# Patient Record
Sex: Male | Born: 1935 | Race: White | Hispanic: No | Marital: Married | Smoking: Former smoker
Health system: Southern US, Community
[De-identification: ages and names within clinical notes are randomized; demographics above are authoritative.]

## PROBLEM LIST (undated history)

## (undated) DIAGNOSIS — I4891 Unspecified atrial fibrillation: Secondary | ICD-10-CM

## (undated) DIAGNOSIS — M109 Gout, unspecified: Secondary | ICD-10-CM

## (undated) DIAGNOSIS — I714 Abdominal aortic aneurysm, without rupture, unspecified: Secondary | ICD-10-CM

## (undated) DIAGNOSIS — I10 Essential (primary) hypertension: Secondary | ICD-10-CM

## (undated) HISTORY — PX: CORONARY ARTERY BYPASS GRAFT: SHX141

## (undated) HISTORY — PX: CARDIAC VALVE REPLACEMENT: SHX585

---

## 2006-01-11 ENCOUNTER — Ambulatory Visit: Payer: Self-pay | Admitting: Gastroenterology

## 2007-04-10 ENCOUNTER — Ambulatory Visit: Payer: Self-pay | Admitting: Ophthalmology

## 2007-04-30 ENCOUNTER — Ambulatory Visit: Payer: Self-pay | Admitting: Ophthalmology

## 2008-12-25 ENCOUNTER — Emergency Department: Payer: Self-pay | Admitting: Emergency Medicine

## 2009-02-18 ENCOUNTER — Ambulatory Visit: Payer: Self-pay | Admitting: Ophthalmology

## 2009-03-03 ENCOUNTER — Ambulatory Visit: Payer: Self-pay | Admitting: Ophthalmology

## 2010-06-10 ENCOUNTER — Ambulatory Visit: Payer: Self-pay | Admitting: Gastroenterology

## 2011-08-12 ENCOUNTER — Emergency Department: Payer: Self-pay | Admitting: Emergency Medicine

## 2011-08-12 LAB — PROTIME-INR
INR: 2.1
Prothrombin Time: 23.6 secs — ABNORMAL HIGH (ref 11.5–14.7)

## 2011-08-12 LAB — CBC WITH DIFFERENTIAL/PLATELET
Eosinophil #: 0.1 10*3/uL (ref 0.0–0.7)
Eosinophil %: 1.8 %
Lymphocyte #: 2 10*3/uL (ref 1.0–3.6)
MCH: 30.1 pg (ref 26.0–34.0)
MCV: 92 fL (ref 80–100)
Monocyte #: 0.6 10*3/uL (ref 0.0–0.7)
Neutrophil %: 62.1 %
Platelet: 173 10*3/uL (ref 150–440)
RBC: 4.83 10*6/uL (ref 4.40–5.90)

## 2012-03-13 ENCOUNTER — Emergency Department: Payer: Self-pay | Admitting: Emergency Medicine

## 2012-03-21 ENCOUNTER — Ambulatory Visit: Payer: Self-pay | Admitting: Internal Medicine

## 2012-03-29 ENCOUNTER — Ambulatory Visit: Payer: Self-pay | Admitting: Vascular Surgery

## 2012-05-07 ENCOUNTER — Ambulatory Visit: Payer: Self-pay | Admitting: Vascular Surgery

## 2012-05-07 LAB — BASIC METABOLIC PANEL
Anion Gap: 5 — ABNORMAL LOW (ref 7–16)
BUN: 19 mg/dL — ABNORMAL HIGH (ref 7–18)
Calcium, Total: 8.6 mg/dL (ref 8.5–10.1)
Chloride: 108 mmol/L — ABNORMAL HIGH (ref 98–107)
Co2: 26 mmol/L (ref 21–32)
Osmolality: 280 (ref 275–301)
Potassium: 4 mmol/L (ref 3.5–5.1)

## 2012-05-07 LAB — CBC
MCH: 27.6 pg (ref 26.0–34.0)
Platelet: 197 10*3/uL (ref 150–440)
RBC: 4.45 10*6/uL (ref 4.40–5.90)
RDW: 14.7 % — ABNORMAL HIGH (ref 11.5–14.5)

## 2012-05-23 ENCOUNTER — Inpatient Hospital Stay: Payer: Self-pay | Admitting: Vascular Surgery

## 2012-05-23 LAB — PROTIME-INR
INR: 1.2
Prothrombin Time: 15.4 secs — ABNORMAL HIGH (ref 11.5–14.7)

## 2012-05-24 LAB — COMPREHENSIVE METABOLIC PANEL
Alkaline Phosphatase: 64 U/L (ref 50–136)
BUN: 24 mg/dL — ABNORMAL HIGH (ref 7–18)
Bilirubin,Total: 0.4 mg/dL (ref 0.2–1.0)
Calcium, Total: 8.3 mg/dL — ABNORMAL LOW (ref 8.5–10.1)
Glucose: 138 mg/dL — ABNORMAL HIGH (ref 65–99)
Potassium: 3.9 mmol/L (ref 3.5–5.1)
SGPT (ALT): 16 U/L (ref 12–78)
Sodium: 141 mmol/L (ref 136–145)

## 2012-05-24 LAB — CBC WITH DIFFERENTIAL/PLATELET
Basophil %: 0.1 %
HGB: 11 g/dL — ABNORMAL LOW (ref 13.0–18.0)
MCH: 28.4 pg (ref 26.0–34.0)
MCHC: 33.5 g/dL (ref 32.0–36.0)
Monocyte #: 0.6 x10 3/mm (ref 0.2–1.0)
Monocyte %: 6.2 %
RBC: 3.89 10*6/uL — ABNORMAL LOW (ref 4.40–5.90)

## 2012-06-16 HISTORY — PX: ABDOMINAL AORTIC ANEURYSM REPAIR: SUR1152

## 2012-11-30 ENCOUNTER — Emergency Department (HOSPITAL_COMMUNITY): Payer: Medicare HMO

## 2012-11-30 ENCOUNTER — Encounter (HOSPITAL_COMMUNITY): Payer: Self-pay | Admitting: *Deleted

## 2012-11-30 ENCOUNTER — Inpatient Hospital Stay (HOSPITAL_COMMUNITY)
Admission: EM | Admit: 2012-11-30 | Discharge: 2012-12-12 | DRG: 064 | Disposition: A | Discharge status: Hospice | Payer: Medicare HMO | Attending: Internal Medicine | Admitting: Internal Medicine

## 2012-11-30 DIAGNOSIS — R4182 Altered mental status, unspecified: Secondary | ICD-10-CM

## 2012-11-30 DIAGNOSIS — K117 Disturbances of salivary secretion: Secondary | ICD-10-CM

## 2012-11-30 DIAGNOSIS — I2510 Atherosclerotic heart disease of native coronary artery without angina pectoris: Secondary | ICD-10-CM | POA: Diagnosis present

## 2012-11-30 DIAGNOSIS — R52 Pain, unspecified: Secondary | ICD-10-CM

## 2012-11-30 DIAGNOSIS — E46 Unspecified protein-calorie malnutrition: Secondary | ICD-10-CM | POA: Diagnosis not present

## 2012-11-30 DIAGNOSIS — Z66 Do not resuscitate: Secondary | ICD-10-CM | POA: Diagnosis not present

## 2012-11-30 DIAGNOSIS — J209 Acute bronchitis, unspecified: Secondary | ICD-10-CM | POA: Diagnosis not present

## 2012-11-30 DIAGNOSIS — I498 Other specified cardiac arrhythmias: Secondary | ICD-10-CM | POA: Diagnosis not present

## 2012-11-30 DIAGNOSIS — R0600 Dyspnea, unspecified: Secondary | ICD-10-CM

## 2012-11-30 DIAGNOSIS — M109 Gout, unspecified: Secondary | ICD-10-CM | POA: Diagnosis present

## 2012-11-30 DIAGNOSIS — G8190 Hemiplegia, unspecified affecting unspecified side: Secondary | ICD-10-CM | POA: Diagnosis present

## 2012-11-30 DIAGNOSIS — I4891 Unspecified atrial fibrillation: Secondary | ICD-10-CM

## 2012-11-30 DIAGNOSIS — I10 Essential (primary) hypertension: Secondary | ICD-10-CM

## 2012-11-30 DIAGNOSIS — J690 Pneumonitis due to inhalation of food and vomit: Secondary | ICD-10-CM | POA: Diagnosis present

## 2012-11-30 DIAGNOSIS — Z87891 Personal history of nicotine dependence: Secondary | ICD-10-CM

## 2012-11-30 DIAGNOSIS — I639 Cerebral infarction, unspecified: Secondary | ICD-10-CM

## 2012-11-30 DIAGNOSIS — R400 Somnolence: Secondary | ICD-10-CM

## 2012-11-30 DIAGNOSIS — G9349 Other encephalopathy: Secondary | ICD-10-CM | POA: Diagnosis present

## 2012-11-30 DIAGNOSIS — R791 Abnormal coagulation profile: Secondary | ICD-10-CM | POA: Diagnosis present

## 2012-11-30 DIAGNOSIS — Z79899 Other long term (current) drug therapy: Secondary | ICD-10-CM

## 2012-11-30 DIAGNOSIS — T17800A Unspecified foreign body in other parts of respiratory tract causing asphyxiation, initial encounter: Secondary | ICD-10-CM

## 2012-11-30 DIAGNOSIS — I609 Nontraumatic subarachnoid hemorrhage, unspecified: Principal | ICD-10-CM | POA: Diagnosis present

## 2012-11-30 DIAGNOSIS — E876 Hypokalemia: Secondary | ICD-10-CM | POA: Diagnosis present

## 2012-11-30 DIAGNOSIS — IMO0002 Reserved for concepts with insufficient information to code with codable children: Secondary | ICD-10-CM | POA: Diagnosis not present

## 2012-11-30 DIAGNOSIS — T45515A Adverse effect of anticoagulants, initial encounter: Secondary | ICD-10-CM | POA: Diagnosis present

## 2012-11-30 DIAGNOSIS — R29810 Facial weakness: Secondary | ICD-10-CM | POA: Diagnosis present

## 2012-11-30 DIAGNOSIS — R1310 Dysphagia, unspecified: Secondary | ICD-10-CM | POA: Diagnosis present

## 2012-11-30 DIAGNOSIS — R471 Dysarthria and anarthria: Secondary | ICD-10-CM | POA: Diagnosis present

## 2012-11-30 DIAGNOSIS — G9340 Encephalopathy, unspecified: Secondary | ICD-10-CM | POA: Diagnosis present

## 2012-11-30 DIAGNOSIS — Z952 Presence of prosthetic heart valve: Secondary | ICD-10-CM

## 2012-11-30 DIAGNOSIS — Z515 Encounter for palliative care: Secondary | ICD-10-CM

## 2012-11-30 DIAGNOSIS — F0390 Unspecified dementia without behavioral disturbance: Secondary | ICD-10-CM | POA: Diagnosis present

## 2012-11-30 DIAGNOSIS — J9600 Acute respiratory failure, unspecified whether with hypoxia or hypercapnia: Secondary | ICD-10-CM

## 2012-11-30 DIAGNOSIS — Z951 Presence of aortocoronary bypass graft: Secondary | ICD-10-CM

## 2012-11-30 DIAGNOSIS — F411 Generalized anxiety disorder: Secondary | ICD-10-CM | POA: Diagnosis not present

## 2012-11-30 DIAGNOSIS — E871 Hypo-osmolality and hyponatremia: Secondary | ICD-10-CM | POA: Diagnosis present

## 2012-11-30 DIAGNOSIS — R110 Nausea: Secondary | ICD-10-CM | POA: Diagnosis not present

## 2012-11-30 HISTORY — DX: Essential (primary) hypertension: I10

## 2012-11-30 HISTORY — DX: Abdominal aortic aneurysm, without rupture: I71.4

## 2012-11-30 HISTORY — DX: Gout, unspecified: M10.9

## 2012-11-30 HISTORY — DX: Abdominal aortic aneurysm, without rupture, unspecified: I71.40

## 2012-11-30 HISTORY — DX: Unspecified atrial fibrillation: I48.91

## 2012-11-30 LAB — URINE MICROSCOPIC-ADD ON

## 2012-11-30 LAB — CBC WITH DIFFERENTIAL/PLATELET
Eosinophils Relative: 0 % (ref 0–5)
HCT: 36 % — ABNORMAL LOW (ref 39.0–52.0)
Hemoglobin: 12.3 g/dL — ABNORMAL LOW (ref 13.0–17.0)
Lymphocytes Relative: 15 % (ref 12–46)
Lymphs Abs: 1.2 10*3/uL (ref 0.7–4.0)
MCV: 77.8 fL — ABNORMAL LOW (ref 78.0–100.0)
Monocytes Absolute: 0.5 10*3/uL (ref 0.1–1.0)
Monocytes Relative: 6 % (ref 3–12)
RBC: 4.63 MIL/uL (ref 4.22–5.81)
WBC: 8.2 10*3/uL (ref 4.0–10.5)

## 2012-11-30 LAB — PROTIME-INR: Prothrombin Time: 18.1 seconds — ABNORMAL HIGH (ref 11.6–15.2)

## 2012-11-30 LAB — COMPREHENSIVE METABOLIC PANEL
Albumin: 3.4 g/dL — ABNORMAL LOW (ref 3.5–5.2)
BUN: 21 mg/dL (ref 6–23)
Creatinine, Ser: 0.93 mg/dL (ref 0.50–1.35)
Potassium: 3.5 mEq/L (ref 3.5–5.1)
Total Protein: 7 g/dL (ref 6.0–8.3)

## 2012-11-30 LAB — URINALYSIS, ROUTINE W REFLEX MICROSCOPIC
Glucose, UA: NEGATIVE mg/dL
Specific Gravity, Urine: 1.016 (ref 1.005–1.030)
Urobilinogen, UA: 0.2 mg/dL (ref 0.0–1.0)

## 2012-11-30 LAB — MRSA PCR SCREENING: MRSA by PCR: POSITIVE — AB

## 2012-11-30 MED ORDER — RAMIPRIL 10 MG PO CAPS: 10.0000 mg | ORAL_CAPSULE | Freq: Every day | ORAL | Status: DC

## 2012-11-30 MED ORDER — NICARDIPINE HCL IN NACL 20-0.86 MG/200ML-% IV SOLN
5.0000 mg/h | INTRAVENOUS | Status: DC
  Administered 2012-12-01 – 2012-12-03 (×5): 2 mg/h via INTRAVENOUS
  Filled 2012-11-30 (×6): qty 200

## 2012-11-30 MED ORDER — METOPROLOL TARTRATE 1 MG/ML IV SOLN
5.0000 mg | Freq: Once | INTRAVENOUS | Status: AC
  Administered 2012-11-30: 5 mg via INTRAVENOUS
  Filled 2012-11-30: qty 5

## 2012-11-30 MED ORDER — VITAMIN K1 10 MG/ML IJ SOLN
10.0000 mg | Freq: Once | INTRAVENOUS | Status: AC
  Administered 2012-11-30: 10 mg via INTRAVENOUS
  Filled 2012-11-30: qty 1

## 2012-11-30 MED ORDER — LABETALOL HCL 5 MG/ML IV SOLN
10.0000 mg | Freq: Once | INTRAVENOUS | Status: AC
  Administered 2012-11-30: 10 mg via INTRAVENOUS

## 2012-11-30 MED ORDER — SODIUM CHLORIDE 0.9 % IV SOLN
250.0000 mL | INTRAVENOUS | Status: DC | PRN
  Administered 2012-11-30: 250 mL via INTRAVENOUS

## 2012-11-30 MED ORDER — METOPROLOL SUCCINATE ER 25 MG PO TB24: 25.0000 mg | ORAL_TABLET | Freq: Every day | ORAL | Status: DC

## 2012-11-30 MED ORDER — LABETALOL HCL 5 MG/ML IV SOLN
INTRAVENOUS | Status: AC
  Filled 2012-11-30: qty 4

## 2012-11-30 MED ORDER — NICARDIPINE HCL IN NACL 20-0.86 MG/200ML-% IV SOLN
5.0000 mg/h | Freq: Once | INTRAVENOUS | Status: AC
  Administered 2012-11-30: 5 mg/h via INTRAVENOUS
  Filled 2012-11-30: qty 200

## 2012-11-30 MED ORDER — FAMOTIDINE IN NACL 20-0.9 MG/50ML-% IV SOLN
20.0000 mg | Freq: Two times a day (BID) | INTRAVENOUS | Status: DC
  Administered 2012-11-30 – 2012-12-02 (×5): 20 mg via INTRAVENOUS
  Filled 2012-11-30 (×7): qty 50

## 2012-11-30 NOTE — ED Provider Notes (Signed)
History    CSN: [id number] Arrival date & time 11/30/12  1444  First MD Initiated Contact with Patient 11/30/12 1505     Chief Complaint  Patient presents with  . Extremity Weakness   HPI Per report from [facility] pt's wife called his son early this AM at 0300 and told their son that He was feeling weak. Pt then went back to bed without further intervention. Pt is noted to have a L eye droop with L arm weakness. Pt denies pain. CBG was 137. No distress noted. Resp symmetrical and unlabored. Last seen normal was last pm prior to bed.  Past Medical History  Diagnosis Date  . Hypertension   . AAA (abdominal aortic aneurysm)   . Gout   . Atrial fibrillation    Past Surgical History  Procedure Laterality Date  . Coronary artery bypass graft    . Cardiac valve replacement    . Abdominal aortic aneurysm repair  02/14   History reviewed. No pertinent family history. History  Substance Use Topics  . Smoking status: Former Smoker    Quit date: 12/01/1962  . Smokeless tobacco: Not on file  . Alcohol Use: Not on file    Review of Systems  Unable to perform ROS: Mental status change    Allergies  Review of patient's allergies indicates no known allergies.  Home Medications   No current outpatient prescriptions on file. BP 148/65  Pulse 47  Temp(Src) 97.7 F (36.5 C) (Oral)  Resp 19  Ht 6' (1.829 m)  Wt 195 lb 1.7 oz (88.5 kg)  BMI 26.46 kg/m2  SpO2 98% Physical Exam  Nursing note and vitals reviewed. Constitutional: He appears well-developed and well-nourished. No distress.  HENT:  Head: Normocephalic and atraumatic.  Eyes: Pupils are equal, round, and reactive to light.  Neck: Normal range of motion.  Cardiovascular: Normal rate and intact distal pulses.   Pulmonary/Chest: No respiratory distress. He has no wheezes. He has no rales.  Abdominal: Normal appearance. He exhibits no distension.  Musculoskeletal: Normal range of motion.  Neurological: He is alert.  He is disoriented. GCS eye subscore is 4. GCS verbal subscore is 4. GCS motor subscore is 6.  Skin: Skin is warm and dry. No rash noted.  Psychiatric: He has a normal mood and affect. His behavior is normal.    ED Course  Procedures (including critical care time)  CRITICAL CARE Performed by: Nelva Nay L Total critical care time: 45 min Critical care time was exclusive of separately billable procedures and treating other patients. Critical care was necessary to treat or prevent imminent or life-threatening deterioration. Critical care was time spent personally by me on the following activities: development of treatment plan with patient and/or surrogate as well as nursing, discussions with consultants, evaluation of patient's response to treatment, examination of patient, obtaining history from patient or surrogate, ordering and performing treatments and interventions, ordering and review of laboratory studies, ordering and review of radiographic studies, pulse oximetry and re-evaluation of patient's condition.  Medications  0.9 %  sodium chloride infusion (250 mLs Intravenous Rate/Dose Verify 12/01/12 0600)  famotidine (PEPCID) IVPB 20 mg (20 mg Intravenous Given 12/01/12 1125)  niCARdipine (CARDENE-IV) infusion (0.1 mg/ml) (2 mg/hr Intravenous Rate/Dose Change 12/01/12 1500)  metoprolol (LOPRESSOR) injection 5 mg (5 mg Intravenous Given 11/30/12 1529)  niCARdipine (CARDENE-IV) infusion (0.1 mg/ml) (0 mg/hr Intravenous Stopped 11/30/12 1719)  phytonadione (VITAMIN K) 10 mg in dextrose 5 % 50 mL IVPB (10 mg Intravenous New Bag/Given  11/30/12 1758)  labetalol (NORMODYNE,TRANDATE) injection 10 mg (10 mg Intravenous Given 11/30/12 1813)  potassium chloride 10 mEq in 100 mL IVPB (10 mEq Intravenous Given 12/01/12 1103)  magnesium sulfate IVPB 2 g 50 mL (2 g Intravenous Given 12/01/12 1158)     Labs Reviewed  MRSA PCR SCREENING - Abnormal; Notable for the following:    MRSA by PCR POSITIVE (*)     All other components within normal limits  COMPREHENSIVE METABOLIC PANEL - Abnormal; Notable for the following:    Glucose, Bld 137 (*)    Albumin 3.4 (*)    GFR calc non Af Amer 76 (*)    GFR calc Af Amer 88 (*)    All other components within normal limits  CBC WITH DIFFERENTIAL - Abnormal; Notable for the following:    Hemoglobin 12.3 (*)    HCT 36.0 (*)    MCV 77.8 (*)    RDW 17.2 (*)    Neutrophils Relative % 79 (*)    All other components within normal limits  PROTIME-INR - Abnormal; Notable for the following:    Prothrombin Time 18.1 (*)    INR 1.54 (*)    All other components within normal limits  URINALYSIS, ROUTINE W REFLEX MICROSCOPIC - Abnormal; Notable for the following:    Color, Urine AMBER (*)    Hgb urine dipstick MODERATE (*)    Ketones, ur 15 (*)    Protein, ur 30 (*)    All other components within normal limits  CBC - Abnormal; Notable for the following:    Hemoglobin 12.0 (*)    HCT 36.0 (*)    MCV 77.8 (*)    MCH 25.9 (*)    RDW 17.1 (*)    Platelets 144 (*)    All other components within normal limits  BASIC METABOLIC PANEL - Abnormal; Notable for the following:    Sodium 133 (*)    Potassium 3.2 (*)    Glucose, Bld 133 (*)    GFR calc non Af Amer 80 (*)    All other components within normal limits  GLUCOSE, CAPILLARY - Abnormal; Notable for the following:    Glucose-Capillary 121 (*)    All other components within normal limits  URINE MICROSCOPIC-ADD ON  MAGNESIUM  PHOSPHORUS  PROTIME-INR  TYPE AND SCREEN  PREPARE FRESH FROZEN PLASMA  ABO/RH   Ct Head Wo Contrast  12/01/2012   *RADIOLOGY REPORT*  Clinical Data: Evaluate size of hemorrhage  CT HEAD WITHOUT CONTRAST  Technique:  Contiguous axial images were obtained from the base of the skull through the vertex without contrast.  Comparison: Prior CT from 11/30/2012  Findings: Previously identified intraventricular hemorrhage centered primarily within the atria of the left lateral ventricle is  again seen, not significantly changed in size as compared to the prior examination.  Minimal left to right midline shift of approximately 3 mm at the level hemorrhage is unchanged. Prominence of the lateral ventricles is similar to slightly increased as compared to the prior examination.  Dilatation of the temporal horn on the left is again seen, also similar.  No new hemorrhages identified.  Small amount of hemorrhage within the right and left ventricles dependently is slightly more prominent as compared to the prior exam.  There is no extra-axial fluid collection. Diffuse atrophy is again noted.  No new acute intracranial infarct is identified. Hypoattenuation within the periventricular white matter consistent with chronic small vessel ischemic changes is again noted.  The paranasal sinuses and  mastoid air cells are clear.  Calvarium is intact.  IMPRESSION: Stable size and appearance of predominately left-sided intraventricular hemorrhage with stable to slightly increased prominence of the lateral ventricles as compared to the prior study.   Original Report Authenticated By: Rise Mu, M.D.   Ct Head Wo Contrast  11/30/2012   *RADIOLOGY REPORT*  Clinical Data: Head pain, left eye droop, left arm weakness  CT HEAD WITHOUT CONTRAST  Technique:  Contiguous axial images were obtained from the base of the skull through the vertex without contrast.  Comparison: None.  Findings:  There is a large amount of intraventricular blood centered primarily within the atria of the left lateral ventricle.  The hemorrhage appears to originate at the level of the choroid plexus. No definite intraparenchymal or subdural hemorrhage.  There is a minimal amount of blood extending to involve the third ventricle. There is minimal dilatation of the temporal horn of the left lateral ventricle (image 14) with associated minimal amount of left to right midline shift measuring approximately 4 mm.  A small amount of blood is also  noted layering dependently within the occipital horn of the right lateral ventricle (image 14)  Diffuse atrophy and sulcal prominence.  No definite acute large territory infarct.  Limited visualization of the paranasal sinuses and mastoid air cells are normal.  Post bilateral cataract surgery.  Regional soft tissues are normal.  No displaced calvarial fracture.  IMPRESSION:  1.  Large amount of interventricular blood centered primarily within the atria of the left lateral ventricle at the level of the choroid plexus. The etiology of this hemorrhage is indeterminate but could be the sequela of trauma, hypercoagulable state and/or choroid plexus vascular malformation. 2.  The amount of left-sided intraventricular blood results in minimal dilatation of the temporal horn of the left lateral ventricle and associated minimal (approximately 4 mm) of left to right midline shift.  3.  Trace amount of intraventricular blood is seen within the dependent portion of the right lateral ventricle.  Above findings discussed with at Dr. Manus Gunning at [phone].   Original Report Authenticated By: Tacey Ruiz, MD   Dg Chest Port 1 View  12/01/2012   *RADIOLOGY REPORT*  Clinical Data: Evaluate for possible infiltrate  PORTABLE CHEST - 1 VIEW  Comparison: 11/30/2012  Findings: Significant cardiac silhouette enlargement stable.  The aorta is uncoiled.  Status post CABG.  Vascular pattern normal.  No infiltrate or consolidation seen on the left.  Right lung is also clear except the costophrenic angle is excluded from the field of view.  IMPRESSION: No evidence of acute infiltrate.   Original Report Authenticated By: Esperanza Heir, M.D.   Dg Chest Portable 1 View  11/30/2012   *RADIOLOGY REPORT*  Clinical Data: Extremity weakness.  Confusion  PORTABLE CHEST - 1 VIEW  Comparison: None  Findings: There is been a median sternotomy and CABG procedure. There is moderate cardiac enlargement.  Small left pleural effusion identified.  There is  decreased aeration the left base which may represent atelectasis or infiltrate.  Right lung appears clear.  IMPRESSION:  1.  Cardiac enlargement. 2.  Suspect left pleural effusion. 3.  Infiltrate versus atelectasis in the left base.   Original Report Authenticated By: Signa Kell, M.D.      1. Altered mental status   2. CVA (cerebral infarction)     MDM    Nelia Shi, MD 12/01/12 1535

## 2012-11-30 NOTE — ED Notes (Signed)
Paged Dr. Leroy Kennedy and updated him on pt's BP and the trending upward.  Order given for labetalol IV x 1 dose 10mg  IV.

## 2012-11-30 NOTE — ED Notes (Signed)
Patient transported to CT 

## 2012-11-30 NOTE — ED Notes (Signed)
Canary Brim, NP with Critical Care at the bedside.

## 2012-11-30 NOTE — Consult Note (Addendum)
Admission H&P    Chief Complaint: confusion, intraventricular hemorrhage HPI: Bryan Lam is an 79 y.o. male with a past medical history significant for hypertension, CAD s/p CAB, s/p valve replacement, atrial fibrillation on coumadin, s/p AAA repair, transferred to [facility] for further management of large intraventricular hemorrhage. [name] is very functional at home, and patient's wife called his son around 3 am today because he was feeling weak and was looking confused. Family report that then they noted that his left face was droopier than the right and the left arm was also weak.Family stated that he is getting more confused.  Patient was taken to [facility] and had a CT brain that revealed a large Large amount of interventricular blood centered primarily within the atria of the left lateral ventricle at the level of the choroid plexus. There is  minimal dilatation of the temporal horn of the left lateral ventricle and associated minimal (approximately 4 mm) of left to right midline shift. Trace amount of intraventricular blood is seen within the dependent portion of the right lateral ventricle. Family stated that he is getting more confused. SBP>220. INR 1.5 LSN: uncertain tPA Given: no, cerebral intraventricular hemorrhage   Past Medical History  Diagnosis Date  . Hypertension   . AAA (abdominal aortic aneurysm)   . Gout   . Atrial fibrillation     Past Surgical History  Procedure Laterality Date  . Coronary artery bypass graft      No family history on file. Social History:  has no tobacco, alcohol, and drug history on file.  Allergies: No Known Allergies   (Not in a hospital admission)  ROS: unable to obtain due mental status.  Physical exam: pleasant male in no apparent distress.Blood pressure 200/100, pulse 73, temperature 98.6 F (37 C), temperature source Oral, resp. rate 20, SpO2 97.00%.  Head: normocephalic. Neck: supple, no bruits, no JVD. Cardiac: no  murmurs. Lungs: clear. Abdomen: soft, no tender, no mass. Extremities: no edema.  Neurologic Examination: Mental Status: Alert and awake and able to follow simple commands. No dysphasia or dysarthria. Cranial Nerves: II: Discs flat bilaterally; Visual fields grossly normal, pupils equal, round, reactive to light III,IV, VI: ptosis not present, extra-ocular motions intact bilaterally V: unreliable VII: mild left face drrop VIII: no tested IX,X: gag reflex present XI: bilateral shoulder shrug XII: midline tongue extension Motor: Mild left sided weakness     Tone and bulk:normal tone throughout; no atrophy noted Sensory: Pinprick and light touch no tested Deep Tendon Reflexes:  1+ all over  Plantars: Right: upgoing   Left: upgoing Cerebellar: normal finger-to-nose,  normal heel-to-shin test Gait:  Unable to test CV: pulses palpable throughout    Results for orders placed during the hospital encounter of 11/30/12 (from the past 48 hour(s))  COMPREHENSIVE METABOLIC PANEL     Status: Abnormal   Collection Time    11/30/12  3:35 PM      Result Value Range   Sodium 135  135 - 145 mEq/L   Potassium 3.5  3.5 - 5.1 mEq/L   Chloride 100  96 - 112 mEq/L   CO2 25  19 - 32 mEq/L   Glucose, Bld 137 (*) 70 - 99 mg/dL   BUN 21  6 - 23 mg/dL   Creatinine, Ser 4.09  0.50 - 1.35 mg/dL   Calcium 8.7  8.4 - 81.1 mg/dL   Total Protein 7.0  6.0 - 8.3 g/dL   Albumin 3.4 (*) 3.5 - 5.2 g/dL  AST 13  0 - 37 U/L   ALT 9  0 - 53 U/L   Alkaline Phosphatase 65  39 - 117 U/L   Total Bilirubin 0.8  0.3 - 1.2 mg/dL   GFR calc non Af Amer 76 (*) >90 mL/min   GFR calc Af Amer 88 (*) >90 mL/min   Comment:            The eGFR has been calculated     using the CKD EPI equation.     This calculation has not been     validated in all clinical     situations.     eGFR's persistently     <90 mL/min signify     possible Chronic Kidney Disease.  CBC WITH DIFFERENTIAL     Status: Abnormal    Collection Time    11/30/12  3:35 PM      Result Value Range   WBC 8.2  4.0 - 10.5 K/uL   RBC 4.63  4.22 - 5.81 MIL/uL   Hemoglobin 12.3 (*) 13.0 - 17.0 g/dL   HCT 16.1 (*) 09.6 - 04.5 %   MCV 77.8 (*) 78.0 - 100.0 fL   MCH 26.6  26.0 - 34.0 pg   MCHC 34.2  30.0 - 36.0 g/dL   RDW 40.9 (*) 81.1 - 91.4 %   Platelets 150  150 - 400 K/uL   Neutrophils Relative % 79 (*) 43 - 77 %   Neutro Abs 6.4  1.7 - 7.7 K/uL   Lymphocytes Relative 15  12 - 46 %   Lymphs Abs 1.2  0.7 - 4.0 K/uL   Monocytes Relative 6  3 - 12 %   Monocytes Absolute 0.5  0.1 - 1.0 K/uL   Eosinophils Relative 0  0 - 5 %   Eosinophils Absolute 0.0  0.0 - 0.7 K/uL   Basophils Relative 0  0 - 1 %   Basophils Absolute 0.0  0.0 - 0.1 K/uL  PROTIME-INR     Status: Abnormal   Collection Time    11/30/12  3:35 PM      Result Value Range   Prothrombin Time 18.1 (*) 11.6 - 15.2 seconds   INR 1.54 (*) 0.00 - 1.49  URINALYSIS, ROUTINE W REFLEX MICROSCOPIC     Status: Abnormal   Collection Time    11/30/12  3:42 PM      Result Value Range   Color, Urine AMBER (*) YELLOW   Comment: BIOCHEMICALS MAY BE AFFECTED BY COLOR   APPearance CLEAR  CLEAR   Specific Gravity, Urine 1.016  1.005 - 1.030   pH 7.0  5.0 - 8.0   Glucose, UA NEGATIVE  NEGATIVE mg/dL   Hgb urine dipstick MODERATE (*) NEGATIVE   Bilirubin Urine NEGATIVE  NEGATIVE   Ketones, ur 15 (*) NEGATIVE mg/dL   Protein, ur 30 (*) NEGATIVE mg/dL   Urobilinogen, UA 0.2  0.0 - 1.0 mg/dL   Nitrite NEGATIVE  NEGATIVE   Leukocytes, UA NEGATIVE  NEGATIVE  URINE MICROSCOPIC-ADD ON     Status: None   Collection Time    11/30/12  3:42 PM      Result Value Range   WBC, UA 0-2  <3 WBC/hpf   RBC / HPF 7-10  <3 RBC/hpf   Bacteria, UA RARE  RARE   Ct Head Wo Contrast  11/30/2012   *RADIOLOGY REPORT*  Clinical Data: Head pain, left eye droop, left arm weakness  CT HEAD WITHOUT CONTRAST  Technique:  Contiguous axial images were obtained from the base of the skull through the  vertex without contrast.  Comparison: None.  Findings:  There is a large amount of intraventricular blood centered primarily within the atria of the left lateral ventricle.  The hemorrhage appears to originate at the level of the choroid plexus. No definite intraparenchymal or subdural hemorrhage.  There is a minimal amount of blood extending to involve the third ventricle. There is minimal dilatation of the temporal horn of the left lateral ventricle (image 14) with associated minimal amount of left to right midline shift measuring approximately 4 mm.  A small amount of blood is also noted layering dependently within the occipital horn of the right lateral ventricle (image 14)  Diffuse atrophy and sulcal prominence.  No definite acute large territory infarct.  Limited visualization of the paranasal sinuses and mastoid air cells are normal.  Post bilateral cataract surgery.  Regional soft tissues are normal.  No displaced calvarial fracture.  IMPRESSION:  1.  Large amount of interventricular blood centered primarily within the atria of the left lateral ventricle at the level of the choroid plexus. The etiology of this hemorrhage is indeterminate but could be the sequela of trauma, hypercoagulable state and/or choroid plexus vascular malformation. 2.  The amount of left-sided intraventricular blood results in minimal dilatation of the temporal horn of the left lateral ventricle and associated minimal (approximately 4 mm) of left to right midline shift.  3.  Trace amount of intraventricular blood is seen within the dependent portion of the right lateral ventricle.  Above findings discussed with at Dr. Manus Gunning at [phone].   Original Report Authenticated By: Tacey Ruiz, MD   Dg Chest Portable 1 View  11/30/2012   *RADIOLOGY REPORT*  Clinical Data: Extremity weakness.  Confusion  PORTABLE CHEST - 1 VIEW  Comparison: None  Findings: There is been a median sternotomy and CABG procedure. There is moderate cardiac  enlargement.  Small left pleural effusion identified.  There is decreased aeration the left base which may represent atelectasis or infiltrate.  Right lung appears clear.  IMPRESSION:  1.  Cardiac enlargement. 2.  Suspect left pleural effusion. 3.  Infiltrate versus atelectasis in the left base.   Original Report Authenticated By: Signa Kell, M.D.    Assessment: 79 y.o. male with large intraventricular hemorrhage. 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  He is alert and awake and follows simple commands at this moment. 1)Significantly elevated BP: started on nicardipine drip. 2) Reverse coumadin 3) Follow CT in am or earlier if neurological changes. 4) Neurosurgery contacted: conservative management for now. Stroke team to resume care in the morning. Discussed at length with the family.  Wyatt Portela, MD [facility] [phone]  11/30/2012, 5:02 PM

## 2012-11-30 NOTE — ED Notes (Signed)
No changes at the present in LOC.  Family at the bedside.  Pt is still a/o x 1 person only.  Cardene drip infusing.  Spoke with Dr. Leroy Kennedy about parameters goal of systolic pressure is 160's-170's.

## 2012-11-30 NOTE — ED Notes (Signed)
Per report from [facility] pt's wife called his son early this AM at 0300 and told their son that He was feeling weak.  Pt then went back to bed without further intervention.  Pt is noted to have a L eye droop with L arm weakness.  Pt denies pain.  CBG was 137.  No distress noted.  Resp symmetrical and unlabored.  Last seen normal was last pm prior to bed.

## 2012-11-30 NOTE — ED Notes (Signed)
Notified Dr. Marvel Plan for patients elevated BP. Received orders to restart Cardene at 2.5mg .

## 2012-11-30 NOTE — H&P (Addendum)
PULMONARY  / CRITICAL CARE MEDICINE  Name: Bryan Lam MRN: [id number] DOB: [birth year]    ADMISSION DATE:  11/30/2012  REFERRING MD :  EDP PRIMARY SERVICE: PCCM  CHIEF COMPLAINT:  ICH  BRIEF PATIENT DESCRIPTION: 79 y/o M who presented with weakness and change in mental status to ER on 7/18.  Found to have CT with large amt of intraventricular blood.     SIGNIFICANT EVENTS / STUDIES:  7/18 CT Head>>>large amt of intraventricular blood on L, trace on R  LINES / TUBES:   CULTURES:   ANTIBIOTICS:   HISTORY OF PRESENT ILLNESS: 79 y/o M with PMH of HTN, AAA s/p repair, gout, CABG with valve replacement on coumadin who presented with weakness and change in mental status to ER on 7/18.  Family reported that he was in his usual state of health yesterday.  Last pm, patient was up and down to restroom all night and had difficulties making it to the restroom.  Went to bed around 3 am and am of admit, woke up this am and was found to have left eye droop and left arm weakness.  Presented to ER and was found to have significant hypertension with systolic in range of 200's.  Further evaluated with CT with exhibited a large amt of intraventricular blood.   PCCM called for ICU admit.   PAST MEDICAL HISTORY :  Past Medical History  Diagnosis Date  . Hypertension   . AAA (abdominal aortic aneurysm)   . Gout   . Atrial fibrillation    Past Surgical History  Procedure Laterality Date  . Coronary artery bypass graft     Prior to Admission medications   Medication Sig Start Date End Date Taking? Authorizing Provider  allopurinol (ZYLOPRIM) 300 MG tablet Take 300 mg by mouth daily.   Yes Historical Provider, MD  aspirin EC 81 MG tablet Take 81 mg by mouth daily.   Yes Historical Provider, MD  metoprolol succinate (TOPROL-XL) 25 MG 24 hr tablet Take 25 mg by mouth daily.   Yes Historical Provider, MD  Omega-3 Fatty Acids (FISH OIL PO) Take by mouth daily.   Yes Historical Provider, MD  warfarin  (COUMADIN) 1 MG tablet Take 4.5 mg by mouth daily.   Yes Historical Provider, MD  ramipril (ALTACE) 10 MG capsule Take 10 mg by mouth daily.    Historical Provider, MD   No Known Allergies  FAMILY HISTORY:  No family history on file. SOCIAL HISTORY:  has no tobacco, alcohol, and drug history on file.  REVIEW OF SYSTEMS:  Unable to complete as patient is altered.   SUBJECTIVE:   VITAL SIGNS: Temp:  [98.6 F (37 C)] 98.6 F (37 C) (07/18 1628) Pulse Rate:  [70-73] 73 (07/18 1630) Resp:  [20] 20 (07/18 1603) BP: (193-200)/(86-100) 200/100 mmHg (07/18 1630) SpO2:  [97 %] 97 % (07/18 1630)  HEMODYNAMICS:    VENTILATOR SETTINGS:    INTAKE / OUTPUT: Intake/Output   None     PHYSICAL EXAMINATION: General:  Elderly male lying on stretcher, no acute distress Neuro: awake, mild lethargy, slow to follow commands, speech slow but clear, able to raise all 4 ext to gravity, LUE with 4/5 weakness  HEENT:  Mm pink/dry, no jvd Cardiovascular:  s1s2 rrr, no m/r/g Lungs:  resp's even/non-labored, lungs bilaterally clear Abdomen:  Round/soft, bsx4 active Musculoskeletal:  No acute deformities Skin:  Warm/dry, no edema  LABS:  Recent Labs Lab 11/30/12 1535  HGB 12.3*  WBC 8.2  PLT  150  NA 135  K 3.5  CL 100  CO2 25  GLUCOSE 137*  BUN 21  CREATININE 0.93  CALCIUM 8.7  AST 13  ALT 9  ALKPHOS 65  BILITOT 0.8  PROT 7.0  ALBUMIN 3.4*  INR 1.54*   No results found for this basename: GLUCAP,  in the last 168 hours  CXR: 7/18 - cardiomegaly, trace L effusion, atx vs infiltrate L base  ASSESSMENT / PLAN:   NEUROLOGIC A:   Left Intraventricular Hemorrhage  P:   -Appreciate Neurology Input -NSGY consulted, recommend if change / decline in mental status would place IVC -Repeat imaging per Neurology -See cardiovascular for BP control -reverse coagulopathy  PULMONARY A: At risk ATX / Aspiration  - in setting of ICH, questionable atx vs infiltrate in LLL.  Most  likely atx P:   -repeat CXR in am  -pulmonary hygiene as able -upright positioning as able  CARDIOVASCULAR A:  HTN Emergency Hx of AAA Repair Hx of CABG & Valve Replacement P:  -cardene gtt for goal SBP 160 -assess EKG -assess ECHO -hold oral Altace, metoprolol, ASA  RENAL A:    P:   -follow sr cr / UOP  GASTROINTESTINAL A:   At Risk Aspiration  P:   -NPO -will speech swallow eval  HEMATOLOGIC A:   Coagulopathy - on chronic coumadin Rx P:  -follow CBC -2 unit FFP, 10 Vit K now -repeat INR in am  INFECTIOUS A:   At Risk Aspiration P:   -monitor respiratory status / cxr  ENDOCRINE A:   Gout P:   -monitor -hold allopurinol   Canary Brim, NP-C [facility] Pgr: [phone] or [phone]    I have personally obtained a history, examined the patient, evaluated laboratory and imaging results, formulated the assessment and plan and placed orders.  CRITICAL CARE: The patient is critically ill with multiple organ systems failure and requires high complexity decision making for assessment and support, frequent evaluation and titration of therapies, application of advanced monitoring technologies and extensive interpretation of multiple databases. Critical Care Time devoted to patient care services described in this note is 60 minutes.    Pulmonary and Critical Care Medicine [facility] Pager: [phone]  11/30/2012, 4:58 PM   Levy Pupa, MD, PhD 12/04/2012, 9:09 AM [facility] [phone] or if no answer [phone]

## 2012-11-30 NOTE — ED Notes (Signed)
Dr. Radford Pax in at the bedside speaking with family.  Pt is alert but to person only.  Resp symmetrical and unlabored.

## 2012-12-01 ENCOUNTER — Inpatient Hospital Stay (HOSPITAL_COMMUNITY): Payer: Medicare HMO

## 2012-12-01 DIAGNOSIS — J9600 Acute respiratory failure, unspecified whether with hypoxia or hypercapnia: Secondary | ICD-10-CM

## 2012-12-01 DIAGNOSIS — I639 Cerebral infarction, unspecified: Secondary | ICD-10-CM

## 2012-12-01 DIAGNOSIS — I6350 Cerebral infarction due to unspecified occlusion or stenosis of unspecified cerebral artery: Secondary | ICD-10-CM

## 2012-12-01 DIAGNOSIS — R4182 Altered mental status, unspecified: Secondary | ICD-10-CM

## 2012-12-01 LAB — CBC
MCH: 25.9 pg — ABNORMAL LOW (ref 26.0–34.0)
MCV: 77.8 fL — ABNORMAL LOW (ref 78.0–100.0)
Platelets: 144 10*3/uL — ABNORMAL LOW (ref 150–400)
RDW: 17.1 % — ABNORMAL HIGH (ref 11.5–15.5)

## 2012-12-01 LAB — BASIC METABOLIC PANEL
Calcium: 9.1 mg/dL (ref 8.4–10.5)
Creatinine, Ser: 0.8 mg/dL (ref 0.50–1.35)
GFR calc Af Amer: >90 mL/min (ref >90)
GFR calc non Af Amer: 80 mL/min — ABNORMAL LOW (ref >90)
Sodium: 133 mEq/L — ABNORMAL LOW (ref 135–145)

## 2012-12-01 LAB — MAGNESIUM: Magnesium: 1.9 mg/dL (ref 1.5–2.5)

## 2012-12-01 LAB — PREPARE FRESH FROZEN PLASMA: Unit division: 0

## 2012-12-01 LAB — PHOSPHORUS: Phosphorus: 2.5 mg/dL (ref 2.3–4.6)

## 2012-12-01 LAB — GLUCOSE, CAPILLARY
Glucose-Capillary: 121 mg/dL — ABNORMAL HIGH (ref 70–99)
Glucose-Capillary: 145 mg/dL — ABNORMAL HIGH (ref 70–99)

## 2012-12-01 MED ORDER — MUPIROCIN 2 % EX OINT
1.0000 "application " | TOPICAL_OINTMENT | Freq: Two times a day (BID) | CUTANEOUS | Status: AC
  Administered 2012-12-01 – 2012-12-06 (×10): 1 via NASAL
  Filled 2012-12-01: qty 22

## 2012-12-01 MED ORDER — CHLORHEXIDINE GLUCONATE CLOTH 2 % EX PADS
6.0000 | MEDICATED_PAD | Freq: Every day | CUTANEOUS | Status: AC
  Administered 2012-12-02 – 2012-12-06 (×5): 6 via TOPICAL

## 2012-12-01 MED ORDER — MAGNESIUM SULFATE 40 MG/ML IJ SOLN
2.0000 g | Freq: Once | INTRAMUSCULAR | Status: AC
  Administered 2012-12-01: 2 g via INTRAVENOUS
  Filled 2012-12-01: qty 50

## 2012-12-01 MED ORDER — POTASSIUM CHLORIDE 10 MEQ/100ML IV SOLN
10.0000 meq | INTRAVENOUS | Status: AC
  Administered 2012-12-01 (×4): 10 meq via INTRAVENOUS
  Filled 2012-12-01 (×2): qty 200

## 2012-12-01 NOTE — Progress Notes (Signed)
Pt's SBP 130s-140s. Turned cardene gtt off.

## 2012-12-01 NOTE — Progress Notes (Signed)
PULMONARY  / CRITICAL CARE MEDICINE  Name: Bryan Lam MRN: [id number] DOB: Oct 07, 1927    ADMISSION DATE:  11/30/2012  REFERRING MD :  EDP PRIMARY SERVICE: PCCM  CHIEF COMPLAINT:  ICH  BRIEF PATIENT DESCRIPTION: 79 y/o M who presented with weakness and change in mental status to ER on 7/18.  Found to have CT with large amt of intraventricular blood.     SIGNIFICANT EVENTS / STUDIES:  7/18 CT Head>>>large amt of intraventricular blood on L, trace on R  LINES / TUBES:   CULTURES:   ANTIBIOTICS:   HISTORY OF PRESENT ILLNESS: 79 y/o M with PMH of HTN, AAA s/p repair, gout, CABG with valve replacement on coumadin who presented with weakness and change in mental status to ER on 7/18.  Family reported that he was in his usual state of health yesterday.  Last pm, patient was up and down to restroom all night and had difficulties making it to the restroom.  Went to bed around 3 am and am of admit, woke up this am and was found to have left eye droop and left arm weakness.  Presented to ER and was found to have significant hypertension with systolic in range of 200's.  Further evaluated with CT with exhibited a large amt of intraventricular blood.   PCCM called for ICU admit.   SUBJECTIVE:   VITAL SIGNS: Temp:  [97.2 F (36.2 C)-98.6 F (37 C)] 97.4 F (36.3 C) (07/19 0426) Pulse Rate:  [36-117] 42 (07/19 1015) Resp:  [16-26] 18 (07/19 1015) BP: (132-221)/(50-120) 168/97 mmHg (07/19 1015) SpO2:  [94 %-100 %] 100 % (07/19 1015) Weight:  [88.5 kg (195 lb 1.7 oz)] 88.5 kg (195 lb 1.7 oz) (07/18 2052)  HEMODYNAMICS:    VENTILATOR SETTINGS:    INTAKE / OUTPUT: Intake/Output     07/18 0701 - 07/19 0700 07/19 0701 - 07/20 0700   P.O.  60   I.V. (mL/kg) 792.1 (9) 42.5 (0.5)   Blood 608    IV Piggyback 50 300   Total Intake(mL/kg) 1450.1 (16.4) 402.5 (4.5)   Urine (mL/kg/hr) 170 80 (0.2)   Total Output 170 80   Net +1280.1 +322.5        Urine Occurrence 2 x 1 x     PHYSICAL  EXAMINATION: General:  Elderly male lying on stretcher, no acute distress Neuro: awake, mild lethargy, slow to follow commands, speech slow but clear, able to raise all 4 ext to gravity, LUE with 4/5 weakness  HEENT:  Mm pink/dry, no jvd Cardiovascular:  s1s2 rrr, no m/r/g Lungs:  resp's even/non-labored, lungs bilaterally clear Abdomen:  Round/soft, bsx4 active Musculoskeletal:  No acute deformities Skin:  Warm/dry, no edema  LABS:  Recent Labs Lab 11/30/12 1535 12/01/12 0501  HGB 12.3* 12.0*  WBC 8.2 8.2  PLT 150 144*  NA 135 133*  K 3.5 3.2*  CL 100 97  CO2 25 24  GLUCOSE 137* 133*  BUN 21 18  CREATININE 0.93 0.80  CALCIUM 8.7 9.1  MG  --  1.9  PHOS  --  2.5  AST 13  --   ALT 9  --   ALKPHOS 65  --   BILITOT 0.8  --   PROT 7.0  --   ALBUMIN 3.4*  --   INR 1.54* 1.23   Recent Labs Lab 12/01/12 0745  GLUCAP 121*   CXR: 7/18 - cardiomegaly, trace L effusion, atx vs infiltrate L base  ASSESSMENT / PLAN:  NEUROLOGIC A:  Left Intraventricular Hemorrhage  P:   - Appreciate Neurology Input - NSGY consulted, no interventions recommended, will reconsult if change/decline in mental status would place IVC. - Repeat imaging per Neurology. - See cardiovascular for BP control. - Reverse coagulopathy, INR in AM.  PULMONARY A: At risk ATX / Aspiration  - in setting of ICH, questionable atx vs infiltrate in LLL.  Most likely atx P:   - Repeat CXR in am. - Pulmonary hygiene as able. - Upright positioning as able.  CARDIOVASCULAR A:  HTN Emergency Hx of AAA Repair Hx of CABG & Valve Replacement P:  - Cardene gtt for goal SBP 160 - Assess ECHO, pending. - Hold oral Altace, metoprolol, ASA.  RENAL A:  HypoMag.  P:   - Replace and recheck. - BMET in AM. - Start diet, KVO IVF.  GASTROINTESTINAL A:   At Risk Aspiration  P:   - Start heart healthy diet.  HEMATOLOGIC A:   Coagulopathy - on chronic coumadin Rx P:  - Follow CBC - 2 unit FFP, 10 Vit  K given, check INR in AM.  INFECTIOUS A:   At Risk Aspiration P:   - Monitor respiratory status / cxr - No abx for now.  ENDOCRINE A:   Gout P:   - Monitor - Hold allopurinol  Spoke with daughter extensively, concern here is respiratory failure if bleed expands further since it expanded this AM.  She wishes for full code for now but will speak with son and likely make patient LCB with no CPR/cardioversion and short term intubation only.  I have personally obtained a history, examined the patient, evaluated laboratory and imaging results, formulated the assessment and plan and placed orders.  CRITICAL CARE: The patient is critically ill with multiple organ systems failure and requires high complexity decision making for assessment and support, frequent evaluation and titration of therapies, application of advanced monitoring technologies and extensive interpretation of multiple databases. Critical Care Time devoted to patient care services described in this note is 35 minutes.   Alyson Reedy, M.D. Pulmonary and Critical Care Medicine [facility] Pager: [phone]  12/01/2012, 10:49 AM

## 2012-12-01 NOTE — Progress Notes (Signed)
Spoke with daughter earlier about code status, informed her of extent of stroke and positive/negative of CPR/cardioversion, after discussion, she wanted to speak with her brother.  After communicating with her brother, she called back requesting LCB with no CPR or cardioversion.  They also spoke and she brought up the idea of feeding tube (after explaining she ment PEG tube) and trach are not on concordance with the patient's desires.  Will make LCB with no CPR/cardioversion for now and will evaluate on a daily bases.  Alyson Reedy, M.D. [facility] Pulmonary/Critical Care Medicine. Pager: [phone]. After hours pager: [phone].

## 2012-12-01 NOTE — Progress Notes (Signed)
eLink Physician-Brief Progress Note Patient Name: Bryan Lam DOB: 04/29/1928 MRN: [id number]  Date of Service  12/01/2012   HPI/Events of Note  Hypokalemia   eICU Interventions  Potassium replaced   Intervention Category Minor Interventions: Electrolytes abnormality - evaluation and management  DETERDING,ELIZABETH 12/01/2012, 6:52 AM

## 2012-12-01 NOTE — Progress Notes (Signed)
Pt became very restless. Pulling at leads, IV, and gown. Placed safety mitts back on hands. Resumed Cardene gtt. Reminded family to keep visitation at 2 at a time. Reduced stimulation.

## 2012-12-01 NOTE — Progress Notes (Signed)
Pt's BP at optimum level. However pt is more lethargic. Not following commands. Desating to 80s. Replaced O2 at 2L. Paged Dr. Marjory Lies. Family at bedside.

## 2012-12-01 NOTE — Progress Notes (Signed)
Pt's SBP ranging from 160s-180s. Spoke with Dr. Marjory Lies. Confirmed SBP goal remains SBP 160s-170s.

## 2012-12-01 NOTE — Progress Notes (Signed)
Talked to patient's nurse who expressed concern with declining mental status. Ordered CT brain.  Wyatt Portela, MD [facility]

## 2012-12-01 NOTE — Progress Notes (Signed)
Stroke Team Rounding History:  "79 y.o. male with a past medical history significant for hypertension, CAD s/p CABG, s/p valve replacement, atrial fibrillation on coumadin, s/p AAA repair, transferred to [facility] for further management of large intraventricular hemorrhage. Was very functional at home, and patient's wife called his son around 3 am today because he was feeling weak and was looking confused. Family report that then they noted that his left face was droopier than the right and the left arm was also weak. Family stated that he is getting more confused.  Patient was taken to [facility] and had a CT brain that revealed a large Large amount of interventricular blood centered primarily within the atria of the left lateral ventricle at the level of the choroid plexus. There is minimal dilatation of the temporal horn of the left lateral ventricle and associated minimal (approximately 4 mm) of left to right midline shift. Trace amount of intraventricular blood is seen within the dependent portion of the right lateral ventricle. Family stated that he is getting more confused."  On admission: SBP>220.  INR 1.5  LSN: uncertain  tPA Given: no, cerebral intraventricular hemorrhage   Subjective: No new events. On and off cardene overnight. Some nausea yesterday.   Objective: Vital signs in last 24 hours: Filed Vitals:   12/01/12 1015 12/01/12 1030 12/01/12 1100 12/01/12 1130  BP: 168/97 173/94 187/102 166/84  Pulse: 42 53 65 40  Temp:      TempSrc:      Resp: 18 18 17 19   Height:      Weight:      SpO2: 100% 100% 100% 99%     Intake/Output from previous day: 07/18 0701 - 07/19 0700 In: 1450.1 [I.V.:792.1; Blood:608; IV Piggyback:50] Out: 170 [Urine:170]  GENERAL EXAM: Patient is in no distress  CARDIOVASCULAR: Regular rate and rhythm, no murmurs, no carotid bruits  NEUROLOGIC: MENTAL STATUS: awake, SLIGHTLY CONFUSED. SAYS SOME WORDS. SLOW RESPONSES. NOT FOLLOWING COMMANDS  CONSISTENTLY.  CRANIAL NERVE: pupils (R 2.5, L 2, POST SURGICAL, MILD RXN), visual fields full to confrontation, extraocular muscles intact, no nystagmus, facial sensation and strength symmetric, uvula midline, shoulder shrug symmetric, tongue midline. MOTOR: RUE 2-3, LUE 3, RLE 2, LLE 2+. MOTOR IMPERSISTENCE SENSORY: normal and symmetric to light touch  Lab Results:  Recent Labs  11/30/12 1535 12/01/12 0501  WBC 8.2 8.2  HGB 12.3* 12.0*  HCT 36.0* 36.0*  PLT 150 144*    BMET  Recent Labs  11/30/12 1535 12/01/12 0501  NA 135 133*  K 3.5 3.2*  CL 100 97  CO2 25 24  GLUCOSE 137* 133*  BUN 21 18  CREATININE 0.93 0.80  CALCIUM 8.7 9.1    Lab Results  Component Value Date   INR 1.23 12/01/2012   INR 1.54* 11/30/2012    LIPIDS: No results found for this basename: chol, trig, hdl, cholhdl, vldl, ldlcalc    HEMOGLOBIN A1C: No results found for this basename: HGBA1C     Studies/Results: Ct Head Wo Contrast  12/01/2012   *RADIOLOGY REPORT*  Clinical Data: Evaluate size of hemorrhage  CT HEAD WITHOUT CONTRAST  Technique:  Contiguous axial images were obtained from the base of the skull through the vertex without contrast.  Comparison: Prior CT from 11/30/2012  Findings: Previously identified intraventricular hemorrhage centered primarily within the atria of the left lateral ventricle is again seen, not significantly changed in size as compared to the prior examination.  Minimal left to right midline shift of approximately 3  mm at the level hemorrhage is unchanged. Prominence of the lateral ventricles is similar to slightly increased as compared to the prior examination.  Dilatation of the temporal horn on the left is again seen, also similar.  No new hemorrhages identified.  Small amount of hemorrhage within the right and left ventricles dependently is slightly more prominent as compared to the prior exam.  There is no extra-axial fluid collection. Diffuse atrophy is again noted.   No new acute intracranial infarct is identified. Hypoattenuation within the periventricular white matter consistent with chronic small vessel ischemic changes is again noted.  The paranasal sinuses and mastoid air cells are clear.  Calvarium is intact.  IMPRESSION: Stable size and appearance of predominately left-sided intraventricular hemorrhage with stable to slightly increased prominence of the lateral ventricles as compared to the prior study.   Original Report Authenticated By: Rise Mu, M.D.   Ct Head Wo Contrast  11/30/2012   *RADIOLOGY REPORT*  Clinical Data: Head pain, left eye droop, left arm weakness  CT HEAD WITHOUT CONTRAST  Technique:  Contiguous axial images were obtained from the base of the skull through the vertex without contrast.  Comparison: None.  Findings:  There is a large amount of intraventricular blood centered primarily within the atria of the left lateral ventricle.  The hemorrhage appears to originate at the level of the choroid plexus. No definite intraparenchymal or subdural hemorrhage.  There is a minimal amount of blood extending to involve the third ventricle. There is minimal dilatation of the temporal horn of the left lateral ventricle (image 14) with associated minimal amount of left to right midline shift measuring approximately 4 mm.  A small amount of blood is also noted layering dependently within the occipital horn of the right lateral ventricle (image 14)  Diffuse atrophy and sulcal prominence.  No definite acute large territory infarct.  Limited visualization of the paranasal sinuses and mastoid air cells are normal.  Post bilateral cataract surgery.  Regional soft tissues are normal.  No displaced calvarial fracture.  IMPRESSION:  1.  Large amount of interventricular blood centered primarily within the atria of the left lateral ventricle at the level of the choroid plexus. The etiology of this hemorrhage is indeterminate but could be the sequela of trauma,  hypercoagulable state and/or choroid plexus vascular malformation. 2.  The amount of left-sided intraventricular blood results in minimal dilatation of the temporal horn of the left lateral ventricle and associated minimal (approximately 4 mm) of left to right midline shift.  3.  Trace amount of intraventricular blood is seen within the dependent portion of the right lateral ventricle.  Above findings discussed with at Dr. Manus Gunning at [phone].   Original Report Authenticated By: Tacey Ruiz, MD   Dg Chest Port 1 View  12/01/2012   *RADIOLOGY REPORT*  Clinical Data: Evaluate for possible infiltrate  PORTABLE CHEST - 1 VIEW  Comparison: 11/30/2012  Findings: Significant cardiac silhouette enlargement stable.  The aorta is uncoiled.  Status post CABG.  Vascular pattern normal.  No infiltrate or consolidation seen on the left.  Right lung is also clear except the costophrenic angle is excluded from the field of view.  IMPRESSION: No evidence of acute infiltrate.   Original Report Authenticated By: Esperanza Heir, M.D.   Dg Chest Portable 1 View  11/30/2012   *RADIOLOGY REPORT*  Clinical Data: Extremity weakness.  Confusion  PORTABLE CHEST - 1 VIEW  Comparison: None  Findings: There is been a median sternotomy and CABG procedure.  There is moderate cardiac enlargement.  Small left pleural effusion identified.  There is decreased aeration the left base which may represent atelectasis or infiltrate.  Right lung appears clear.  IMPRESSION:  1.  Cardiac enlargement. 2.  Suspect left pleural effusion. 3.  Infiltrate versus atelectasis in the left base.   Original Report Authenticated By: Signa Kell, M.D.    MRI brain  MRA head   Carotid u/s  TTE  Medications:  . famotidine (PEPCID) IV  20 mg Intravenous Q12H  . magnesium sulfate 1 - 4 g bolus IVPB  2 g Intravenous Once   . niCARDipine 1.5 mg/hr (12/01/12 0830)     Assessment: 79 y.o. male with hypertension, CAD s/p CABG, s/p valve replacement, atrial  fibrillation on coumadin, s/p AAA repair, transferred to [facility] for further management of large intraventricular hemorrhage.  Stroke Risk Factors - hypertension   LOS: 1 day   Plan: - goal SBP < 160; on cardene prn - Antiplatelet/Anticoagulation therapy: on hold due to large IVH - Head of bed < 30 degrees - Maintain euvolemia, euglycemia, euthermia - Telemetry - Frequent neuro checks - PT consult, OT consult, Speech consult - on hold - follow clinical exam to determine need for additional CT imaging - family (wife, daughter) and patient updated   Suanne Marker, MD 12/01/2012, 12:21 PM Certified in Neurology, Neurophysiology and Neuroimaging [facility] - Stroke Team  Please refer to [email or web address] for on-call Stroke MD

## 2012-12-02 LAB — PHOSPHORUS: Phosphorus: 2.5 mg/dL (ref 2.3–4.6)

## 2012-12-02 LAB — CBC
Hemoglobin: 11.1 g/dL — ABNORMAL LOW (ref 13.0–17.0)
MCH: 25.3 pg — ABNORMAL LOW (ref 26.0–34.0)
MCHC: 32.5 g/dL (ref 30.0–36.0)
Platelets: 135 10*3/uL — ABNORMAL LOW (ref 150–400)

## 2012-12-02 LAB — BASIC METABOLIC PANEL
Calcium: 8.7 mg/dL (ref 8.4–10.5)
GFR calc Af Amer: >90 mL/min (ref >90)
GFR calc non Af Amer: 79 mL/min — ABNORMAL LOW (ref >90)
Potassium: 3.6 mEq/L (ref 3.5–5.1)
Sodium: 133 mEq/L — ABNORMAL LOW (ref 135–145)

## 2012-12-02 LAB — MAGNESIUM: Magnesium: 2 mg/dL (ref 1.5–2.5)

## 2012-12-02 MED ORDER — RAMIPRIL 10 MG PO CAPS
10.0000 mg | ORAL_CAPSULE | Freq: Every day | ORAL | Status: DC
  Administered 2012-12-02 – 2012-12-04 (×3): 10 mg via ORAL
  Filled 2012-12-02 (×4): qty 1

## 2012-12-02 MED ORDER — POTASSIUM CHLORIDE CRYS ER 20 MEQ PO TBCR
40.0000 meq | EXTENDED_RELEASE_TABLET | Freq: Three times a day (TID) | ORAL | Status: AC
  Administered 2012-12-02 (×2): 40 meq via ORAL
  Filled 2012-12-02: qty 1
  Filled 2012-12-02: qty 2

## 2012-12-02 NOTE — Progress Notes (Signed)
Pt likes chocolate-flavored Ensure drink.

## 2012-12-02 NOTE — Progress Notes (Signed)
Stroke Team Rounding History:  "79 y.o. male with a past medical history significant for hypertension, CAD s/p CABG, s/p valve replacement, atrial fibrillation on coumadin, s/p AAA repair, transferred to [facility] for further management of large intraventricular hemorrhage. Was very functional at home, and patient's wife called his son around 3 am today because he was feeling weak and was looking confused. Family report that then they noted that his left face was droopier than the right and the left arm was also weak. Family stated that he is getting more confused.  Patient was taken to [facility] and had a CT brain that revealed a large Large amount of interventricular blood centered primarily within the atria of the left lateral ventricle at the level of the choroid plexus. There is minimal dilatation of the temporal horn of the left lateral ventricle and associated minimal (approximately 4 mm) of left to right midline shift. Trace amount of intraventricular blood is seen within the dependent portion of the right lateral ventricle. Family stated that he is getting more confused."  On admission: SBP>220.  INR 1.5  LSN: uncertain  tPA Given: no, cerebral intraventricular hemorrhage   Subjective: More lethargy yesterday, had f/u CT head which was stable. Today more alert, talking and eating. Family pleased with improvement. Patient denies HA today.   Objective: Vital signs in last 24 hours: Filed Vitals:   12/02/12 1148 12/02/12 1200 12/02/12 1230 12/02/12 1300  BP:  173/80 170/75 167/72  Pulse:  50 72 43  Temp: 97.6 F (36.4 C)     TempSrc: Oral     Resp:  18 18 19   Height:      Weight:      SpO2:  99% 99% 100%     Intake/Output from previous day: 07/19 0701 - 07/20 0700 In: 1666.5 [P.O.:160; I.V.:956.5; IV Piggyback:550] Out: 2010 [Urine:2010]  GENERAL EXAM: Patient is in no distress  CARDIOVASCULAR: Regular rate and rhythm, no murmurs, no carotid bruits  NEUROLOGIC: MENTAL  STATUS: ASLEEPT BUT WAKES TO VOICE. SAYS SOME WORDS. SLOW RESPONSES. FOLLOWS COMMANDS INTERMITTENTLY. CRANIAL NERVE: pupils (R 2.5, L 2, POST SURGICAL, MILD RXN), visual fields full to confrontation, extraocular muscles intact, no nystagmus, facial sensation and strength symmetric, uvula midline, shoulder shrug symmetric, tongue midline. MOTOR: RUE 2, LUE 3, RLE 1, LLE 3. SENSORY: normal and symmetric to light touch  Lab Results:  Recent Labs  12/01/12 0501 12/02/12 0230  WBC 8.2 8.6  HGB 12.0* 11.1*  HCT 36.0* 34.2*  PLT 144* 135*    BMET  Recent Labs  12/01/12 0501 12/02/12 0230  NA 133* 133*  K 3.2* 3.6  CL 97 98  CO2 24 25  GLUCOSE 133* 127*  BUN 18 22  CREATININE 0.80 0.83  CALCIUM 9.1 8.7    Lab Results  Component Value Date   INR 1.23 12/01/2012   INR 1.54* 11/30/2012    LIPIDS: No results found for this basename: chol,  trig,  hdl,  cholhdl,  vldl,  ldlcalc    HEMOGLOBIN A1C: No results found for this basename: HGBA1C     Studies/Results: Ct Head Wo Contrast  12/01/2012   *RADIOLOGY REPORT*  Clinical Data: 79 year old male with intracranial hemorrhage.  CT HEAD WITHOUT CONTRAST  Technique:  Contiguous axial images were obtained from the base of the skull through the vertex without contrast.  Comparison: 12/01/2012 and earlier.  Findings: Study is mildly degraded by motion artifact despite repeated imaging attempts.  Stable visualized osseous structures.  Stable paranasal  sinuses and mastoids.  Stable orbit and scalp soft tissues.  Confluent left lateral intraventricular hemorrhage, volume not significantly changed (up to 69 x 26 mm transaxially).  Extension of hemorrhage into the other ventricles likewise not significantly changed.  As before, evidence of some trapping of the left lateral ventricle, with enlargement of the left temporal horn.  No midline shift.  Left hemisphere cerebral edema not significantly changed.  No superimposed acute cortically based  infarct.  Stable gray-white matter differentiation elsewhere.  Basilar cisterns remain patent. Stable appearance of the intracranial vascular structures with a degree of dolichoectasia.  IMPRESSION:  Stable.  Intraventricular hemorrhage centered at the left lateral ventricle, with some degree of trapping of that ventricle, and left hemisphere edema.  No new intracranial abnormality.   Original Report Authenticated By: Erskine Speed, M.D.   Ct Head Wo Contrast  12/01/2012   *RADIOLOGY REPORT*  Clinical Data: Evaluate size of hemorrhage  CT HEAD WITHOUT CONTRAST  Technique:  Contiguous axial images were obtained from the base of the skull through the vertex without contrast.  Comparison: Prior CT from 11/30/2012  Findings: Previously identified intraventricular hemorrhage centered primarily within the atria of the left lateral ventricle is again seen, not significantly changed in size as compared to the prior examination.  Minimal left to right midline shift of approximately 3 mm at the level hemorrhage is unchanged. Prominence of the lateral ventricles is similar to slightly increased as compared to the prior examination.  Dilatation of the temporal horn on the left is again seen, also similar.  No new hemorrhages identified.  Small amount of hemorrhage within the right and left ventricles dependently is slightly more prominent as compared to the prior exam.  There is no extra-axial fluid collection. Diffuse atrophy is again noted.  No new acute intracranial infarct is identified. Hypoattenuation within the periventricular white matter consistent with chronic small vessel ischemic changes is again noted.  The paranasal sinuses and mastoid air cells are clear.  Calvarium is intact.  IMPRESSION: Stable size and appearance of predominately left-sided intraventricular hemorrhage with stable to slightly increased prominence of the lateral ventricles as compared to the prior study.   Original Report Authenticated By:  Rise Mu, M.D.   Ct Head Wo Contrast  11/30/2012   *RADIOLOGY REPORT*  Clinical Data: Head pain, left eye droop, left arm weakness  CT HEAD WITHOUT CONTRAST  Technique:  Contiguous axial images were obtained from the base of the skull through the vertex without contrast.  Comparison: None.  Findings:  There is a large amount of intraventricular blood centered primarily within the atria of the left lateral ventricle.  The hemorrhage appears to originate at the level of the choroid plexus. No definite intraparenchymal or subdural hemorrhage.  There is a minimal amount of blood extending to involve the third ventricle. There is minimal dilatation of the temporal horn of the left lateral ventricle (image 14) with associated minimal amount of left to right midline shift measuring approximately 4 mm.  A small amount of blood is also noted layering dependently within the occipital horn of the right lateral ventricle (image 14)  Diffuse atrophy and sulcal prominence.  No definite acute large territory infarct.  Limited visualization of the paranasal sinuses and mastoid air cells are normal.  Post bilateral cataract surgery.  Regional soft tissues are normal.  No displaced calvarial fracture.  IMPRESSION:  1.  Large amount of interventricular blood centered primarily within the atria of the left lateral ventricle at the  level of the choroid plexus. The etiology of this hemorrhage is indeterminate but could be the sequela of trauma, hypercoagulable state and/or choroid plexus vascular malformation. 2.  The amount of left-sided intraventricular blood results in minimal dilatation of the temporal horn of the left lateral ventricle and associated minimal (approximately 4 mm) of left to right midline shift.  3.  Trace amount of intraventricular blood is seen within the dependent portion of the right lateral ventricle.  Above findings discussed with at Dr. Manus Gunning at [phone].   Original Report Authenticated By: Tacey Ruiz, MD   Dg Chest Port 1 View  12/01/2012   *RADIOLOGY REPORT*  Clinical Data: Evaluate for possible infiltrate  PORTABLE CHEST - 1 VIEW  Comparison: 11/30/2012  Findings: Significant cardiac silhouette enlargement stable.  The aorta is uncoiled.  Status post CABG.  Vascular pattern normal.  No infiltrate or consolidation seen on the left.  Right lung is also clear except the costophrenic angle is excluded from the field of view.  IMPRESSION: No evidence of acute infiltrate.   Original Report Authenticated By: Esperanza Heir, M.D.   Dg Chest Portable 1 View  11/30/2012   *RADIOLOGY REPORT*  Clinical Data: Extremity weakness.  Confusion  PORTABLE CHEST - 1 VIEW  Comparison: None  Findings: There is been a median sternotomy and CABG procedure. There is moderate cardiac enlargement.  Small left pleural effusion identified.  There is decreased aeration the left base which may represent atelectasis or infiltrate.  Right lung appears clear.  IMPRESSION:  1.  Cardiac enlargement. 2.  Suspect left pleural effusion. 3.  Infiltrate versus atelectasis in the left base.   Original Report Authenticated By: Signa Kell, M.D.    MRI brain  MRA head   Carotid u/s  TTE  Medications:  . Chlorhexidine Gluconate Cloth  6 each Topical Q0600  . famotidine (PEPCID) IV  20 mg Intravenous Q12H  . mupirocin ointment  1 application Nasal BID  . potassium chloride  40 mEq Oral TID  . ramipril  10 mg Oral Daily   . niCARDipine Stopped (12/02/12 1100)     Assessment: 79 y.o. male with hypertension, CAD s/p CABG, s/p valve replacement (?TYPE), atrial fibrillation previously on coumadin until admission, s/p AAA repair, transferred to [facility] for further management of large left sided intraventricular hemorrhage.  Stroke Risk Factors - hypertension   LOS: 2 days   Plan: - goal SBP < 160; on cardene prn - Antiplatelet/Anticoagulation therapy: on hold due to large IVH - Head of bed < 30 degrees - Maintain  euvolemia, euglycemia, euthermia - Telemetry, frequent neuro checks --> continue NICU monitoring; consider transfer to floor on 12/03/12 if better - PT consult, OT consult - on hold - follow clinical exam to determine need for additional CT imaging - family (wife, daughter) and patient updated   Suanne Marker, MD 12/02/2012, 1:16 PM Certified in Neurology, Neurophysiology and Neuroimaging [facility] - Stroke Team  Please refer to [email or web address] for on-call Stroke MD

## 2012-12-02 NOTE — Progress Notes (Signed)
PULMONARY  / CRITICAL CARE MEDICINE  Name: Oakley Kossman MRN: [id number] DOB: Aug 19, 1927    ADMISSION DATE:  11/30/2012  REFERRING MD :  EDP PRIMARY SERVICE: PCCM  CHIEF COMPLAINT:  ICH  BRIEF PATIENT DESCRIPTION: 79 y/o M who presented with weakness and change in mental status to ER on 7/18.  Found to have CT with large amt of intraventricular blood.     SIGNIFICANT EVENTS / STUDIES:  7/18 CT Head>>>large amt of intraventricular blood on L, trace on R  LINES / TUBES:   CULTURES: none  ANTIBIOTICS: none  SUBJECTIVE/overnight:  Mental status improved this am.  Cardene gtt near off.   VITAL SIGNS: Temp:  [97.9 F (36.6 C)-98.9 F (37.2 C)] 98.9 F (37.2 C) (07/20 0800) Pulse Rate:  [40-128] 48 (07/20 1000) Resp:  [15-25] 18 (07/20 1000) BP: (121-198)/(60-171) 143/60 mmHg (07/20 1000) SpO2:  [97 %-100 %] 99 % (07/20 1000) Weight:  [196 lb 6.9 oz (89.1 kg)] 196 lb 6.9 oz (89.1 kg) (07/20 0500)  HEMODYNAMICS:    VENTILATOR SETTINGS:    INTAKE / OUTPUT: Intake/Output     07/19 0701 - 07/20 0700 07/20 0701 - 07/21 0700   P.O. 160 360   I.V. (mL/kg) 956.5 (10.7) 110 (1.2)   Blood     IV Piggyback 550    Total Intake(mL/kg) 1666.5 (18.7) 470 (5.3)   Urine (mL/kg/hr) 2010 (0.9) 245 (0.7)   Total Output 2010 245   Net -343.5 +225        Urine Occurrence 3 x      PHYSICAL EXAMINATION: General:  Elderly male NAD in bed  Neuro: awake, slow to follow commands, nods appropriately, MAE gen weakness  HEENT:  Mm pink/dry, no jvd Cardiovascular:  s1s2 rrr, no m/r/g Lungs:  resp's even/non-labored, lungs bilaterally clear Abdomen:  Round/soft, bsx4 active Musculoskeletal:  No acute deformities Skin:  Warm/dry, no edema  LABS:  Recent Labs Lab 11/30/12 1535 12/01/12 0501 12/02/12 0230  HGB 12.3* 12.0* 11.1*  WBC 8.2 8.2 8.6  PLT 150 144* 135*  NA 135 133* 133*  K 3.5 3.2* 3.6  CL 100 97 98  CO2 25 24 25   GLUCOSE 137* 133* 127*  BUN 21 18 22   CREATININE  0.93 0.80 0.83  CALCIUM 8.7 9.1 8.7  MG  --  1.9 2.0  PHOS  --  2.5 2.5  AST 13  --   --   ALT 9  --   --   ALKPHOS 65  --   --   BILITOT 0.8  --   --   PROT 7.0  --   --   ALBUMIN 3.4*  --   --   INR 1.54* 1.23  --     Recent Labs Lab 12/01/12 0745 12/01/12 1701  GLUCAP 121* 145*   CXR:  No new cxr 7/20  ASSESSMENT / PLAN:  NEUROLOGIC A:   Left Intraventricular Hemorrhage  P:   - Appreciate Neurology Input - NSGY consulted, no interventions recommended, will reconsult if change/decline in mental status would place IVC. - Repeat imaging per Neurology. - See cardiovascular for BP control. - f/u INR  PULMONARY A: At risk ATX / Aspiration  - in setting of ICH, questionable atx vs infiltrate in LLL.  Most likely atx P:   - Repeat CXR in am. - Pulmonary hygiene as able. - Upright positioning as able.  CARDIOVASCULAR A:  HTN Emergency Hx of AAA Repair Hx of CABG & Valve Replacement P:  -  Cardene gtt for goal SBP 160 - wean as able  - ECHO pending. - resume home altace, cont hold lopressor, asa.  RENAL A:  HypoMag. Hyponatremia - mild P:   - Replace and recheck. - BMET in AM. - kvo ivf   GASTROINTESTINAL A:   At Risk Aspiration  P:   - heart healthy diet with close supervision - doing ok per RN  HEMATOLOGIC A:   Coagulopathy - on chronic coumadin Rx P:  - Follow CBC - f/u INR -will need to make decisions re: anticoag in future (valve replacement)   INFECTIOUS A:   At Risk Aspiration P:   - Monitor respiratory status / cxr - No abx for now.  ENDOCRINE A:   Gout P:   - Monitor - Hold allopurinol  *Care during the described time interval was provided by me and/or other providers on the critical care team. I have reviewed this patient's available data, including medical history, events of note, physical examination and test results as part of my evaluation.  Alyson Reedy, M.D. [facility] Pulmonary/Critical Care Medicine. Pager:  [phone]. After hours pager: [phone].

## 2012-12-03 ENCOUNTER — Inpatient Hospital Stay (HOSPITAL_COMMUNITY): Payer: Medicare HMO

## 2012-12-03 ENCOUNTER — Encounter (HOSPITAL_COMMUNITY): Payer: Self-pay | Admitting: Radiology

## 2012-12-03 DIAGNOSIS — I10 Essential (primary) hypertension: Secondary | ICD-10-CM

## 2012-12-03 DIAGNOSIS — I4891 Unspecified atrial fibrillation: Secondary | ICD-10-CM

## 2012-12-03 DIAGNOSIS — I619 Nontraumatic intracerebral hemorrhage, unspecified: Secondary | ICD-10-CM

## 2012-12-03 LAB — CBC
MCH: 25.4 pg — ABNORMAL LOW (ref 26.0–34.0)
MCHC: 32.1 g/dL (ref 30.0–36.0)
Platelets: 161 10*3/uL (ref 150–400)
RDW: 17.5 % — ABNORMAL HIGH (ref 11.5–15.5)

## 2012-12-03 LAB — BASIC METABOLIC PANEL
BUN: 28 mg/dL — ABNORMAL HIGH (ref 6–23)
Calcium: 8.8 mg/dL (ref 8.4–10.5)
Chloride: 98 mEq/L (ref 96–112)
Creatinine, Ser: 0.83 mg/dL (ref 0.50–1.35)
GFR calc Af Amer: >90 mL/min (ref >90)
GFR calc non Af Amer: 79 mL/min — ABNORMAL LOW (ref >90)

## 2012-12-03 LAB — PROTIME-INR
INR: 1.22 (ref 0.00–1.49)
Prothrombin Time: 15.1 seconds (ref 11.6–15.2)

## 2012-12-03 MED ORDER — METOPROLOL SUCCINATE ER 25 MG PO TB24
25.0000 mg | ORAL_TABLET | Freq: Every day | ORAL | Status: DC
  Administered 2012-12-03 – 2012-12-04 (×2): 25 mg via ORAL
  Filled 2012-12-03 (×3): qty 1

## 2012-12-03 MED ORDER — HYDRALAZINE HCL 20 MG/ML IJ SOLN
10.0000 mg | INTRAMUSCULAR | Status: DC | PRN
  Administered 2012-12-03: 40 mg via INTRAVENOUS
  Administered 2012-12-03: 20 mg via INTRAVENOUS
  Filled 2012-12-03: qty 2
  Filled 2012-12-03: qty 1

## 2012-12-03 NOTE — Progress Notes (Signed)
Foley removed at 1130 today.  By 1800, pt had not voided.  When bladder scanned, there was of urine.  Per protocol, pt will be bladder scanned again at 2000 and if >452mL will call MD.

## 2012-12-03 NOTE — Progress Notes (Signed)
Stroke Team Progress Note  HISTORY 79 y.o. male with a past medical history significant for hypertension, CAD s/p CABG, s/p valve replacement, atrial fibrillation on coumadin, s/p AAA repair, transferred to [facility] from [facility] for further management of large intraventricular hemorrhage. Was very functional at home, and patient's wife called his son around 3 am today 11/30/2012 because he was feeling weak and was looking confused. Family report that then they noted that his left face was droopier than the right and the left arm was also weak. Family stated that he is getting more confused. Patient was taken to [facility] and had a CT brain that revealed a large Large amount of interventricular blood centered primarily within the atria of the left lateral ventricle at the level of the choroid plexus. There is minimal dilatation of the temporal horn of the left lateral ventricle and associated minimal (approximately 4 mm) of left to right midline shift. Trace amount of intraventricular blood is seen within the dependent portion of the right lateral ventricle. Family stated that he is getting more confused."  He was admitted to the neuro ICU for further evaluation and treatment.  SUBJECTIVE His wife and daughter are at the bedside.  Overall he feels his condition is gradually improving.   OBJECTIVE Most recent Vital Signs: Filed Vitals:   12/03/12 0630 12/03/12 0700 12/03/12 0730 12/03/12 0800  BP: 152/81 157/81 162/80   Pulse: 64 65 65   Temp:    97.7 F (36.5 C)  TempSrc:    Oral  Resp: 17 17 17    Height:      Weight:      SpO2: 99% 99% 99%    CBG (last 3)   Recent Labs  12/01/12 0745 12/01/12 1701  GLUCAP 121* 145*    IV Fluid Intake:   . niCARDipine 2.5 mg/hr (12/03/12 0800)    MEDICATIONS  . Chlorhexidine Gluconate Cloth  6 each Topical Q0600  . famotidine (PEPCID) IV  20 mg Intravenous Q12H  . mupirocin ointment  1 application Nasal BID  . ramipril  10 mg Oral  Daily   PRN:  sodium chloride  Diet:  Cardiac thin liquids Activity:  Bedrest DVT Prophylaxis:  SCDs   CLINICALLY SIGNIFICANT STUDIES Basic Metabolic Panel:  Recent Labs Lab 12/01/12 0501 12/02/12 0230 12/03/12 0540  NA 133* 133* 132*  K 3.2* 3.6 3.6  CL 97 98 98  CO2 24 25 25   GLUCOSE 133* 127* 128*  BUN 18 22 28*  CREATININE 0.80 0.83 0.83  CALCIUM 9.1 8.7 8.8  MG 1.9 2.0  --   PHOS 2.5 2.5  --    Liver Function Tests:  Recent Labs Lab 11/30/12 1535  AST 13  ALT 9  ALKPHOS 65  BILITOT 0.8  PROT 7.0  ALBUMIN 3.4*   CBC:  Recent Labs Lab 11/30/12 1535  12/02/12 0230 12/03/12 0540  WBC 8.2  < > 8.6 8.9  NEUTROABS 6.4  --   --   --   HGB 12.3*  < > 11.1* 11.6*  HCT 36.0*  < > 34.2* 36.1*  MCV 77.8*  < > 77.9* 79.0  PLT 150  < > 135* 161  < > = values in this interval not displayed. Coagulation:  Recent Labs Lab 11/30/12 1535 12/01/12 0501 12/03/12 0540  LABPROT 18.1* 15.2 15.1  INR 1.54* 1.23 1.22   Cardiac Enzymes: No results found for this basename: CKTOTAL, CKMB, CKMBINDEX, TROPONINI,  in the last 168 hours Urinalysis:  Recent  Labs Lab 11/30/12 1542  COLORURINE AMBER*  LABSPEC 1.016  PHURINE 7.0  GLUCOSEU NEGATIVE  HGBUR MODERATE*  BILIRUBINUR NEGATIVE  KETONESUR 15*  PROTEINUR 30*  UROBILINOGEN 0.2  NITRITE NEGATIVE  LEUKOCYTESUR NEGATIVE   Lipid Panel No results found for this basename: chol, trig, hdl, cholhdl, vldl, ldlcalc   HgbA1C  No results found for this basename: HGBA1C   Urine Drug Screen:   No results found for this basename: labopia, cocainscrnur, labbenz, amphetmu, thcu, labbarb    Alcohol Level: No results found for this basename: ETH,  in the last 168 hours   CT of the brain   12/01/2012    Stable.  Intraventricular hemorrhage centered at the left lateral ventricle, with some degree of trapping of that ventricle, and left hemisphere edema.  No new intracranial abnormality.     12/01/2012   Stable size and  appearance of predominately left-sided intraventricular hemorrhage with stable to slightly increased prominence of the lateral ventricles as compared to the prior study.    11/30/2012     1.  Large amount of interventricular blood centered primarily within the atria of the left lateral ventricle at the level of the choroid plexus. The etiology of this hemorrhage is indeterminate but could be the sequela of trauma, hypercoagulable state and/or choroid plexus vascular malformation. 2.  The amount of left-sided intraventricular blood results in minimal dilatation of the temporal horn of the left lateral ventricle and associated minimal (approximately 4 mm) of left to right midline shift.  3.  Trace amount of intraventricular blood is seen within the dependent portion of the right lateral ventricle.    MRI of the brain    MRA of the brain    2D Echocardiogram    Carotid Doppler    CXR   12/03/2012    1.  Moderate cardiomegaly with pulmonary venous congestion, but no frank pulmonary edema. 2.  Low lung volumes with minimal bibasilar subsegmental atelectasis. 3.  Atherosclerosis.  12/01/2012    No evidence of acute infiltrate.    11/30/2012   1.  Cardiac enlargement. 2.  Suspect left pleural effusion. 3.  Infiltrate versus atelectasis in the left base.    EKG     Therapy Recommendations   GENERAL EXAM:  Patient is in no distress  CARDIOVASCULAR:  Regular rate and rhythm, no murmurs, no carotid bruits  NEUROLOGIC:  MENTAL STATUS: ASLEEPT BUT WAKES TO VOICE. SAYS SOME WORDS. SLOW RESPONSES. FOLLOWS COMMANDS INTERMITTENTLY.  CRANIAL NERVE: pupils (R 2.5, L 2, POST SURGICAL, MILD RXN), visual fields full to confrontation, extraocular muscles intact, no nystagmus, facial sensation and strength symmetric, uvula midline, shoulder shrug symmetric, tongue midline.  MOTOR: RUE 2, LUE 3, RLE 1, LLE 3.  SENSORY: normal and symmetric to light touch   ASSESSMENT [name] is a 79 y.o. male presenting  with left sided  weakness and confusion. Imaging confirms a left lateral intraventricular hemorrhage. Hemorrhage felt to be secondary to combination of Hypertension and coumadin use.  On aspirin 81 mg orally every day and warfarin prior to admission. Now on no antithrombotics due to hemorrhage for secondary stroke prevention. Patient with resultant right field cut, right  hemiparesis. Work up completed.,  atrial fibrillation, on chronic coumadin S/p valve replacement, on chronic coumadin Hypertension S/p AAA repair CAD - s/p CABG No dementia at baseline  Hospital day # 3  TREATMENT/PLAN  Continue home BP medications  OOB, therapy evals, rehab consult  Transfer to the floor  Dr. Pearlean Brownie  discussed diagnosis, prognosis,  treatment options and plan of care with  Pt, wife, dtr and Dr. Sung Amabile.   Annie Main, MSN, RN, ANVP-BC, ANP-BC, [facility] Pager: [phone] 12/03/2012 8:13 AM  I have personally obtained a history, examined the patient, evaluated imaging results, and formulated the assessment and plan of care. I agree with the above. Delia Heady, MD

## 2012-12-03 NOTE — Progress Notes (Signed)
Physical Therapy Evaluation Patient Details Name: Bryan Lam MRN: [id number] DOB: Sep 23, 1927 Today's Date: 12/03/2012 Time: 1350-1410 PT Time Calculation (min): 20 min  PT Assessment / Plan / Recommendation History of Present Illness    Patient with sudden onset of confusion, facial droop and left sided weakness on 11/30/12 and significant HTN with SBP in 200 range. CT head at [facility] with large left lateral intraventricular hemorrhage Chronic coumadin reversed and transferred to [facility] from [facility] for further management of large intraventricular blood centered primarily within the atria of the left lateral ventricle at the level of the choroid plexus    Clinical Impression  Pt admitted with CVA. Pt currently with functional limitations due to the deficits listed below (see PT Problem List). Pt needed assist to sit EOB as pt leaning posteriorly and to his left.  Pt was able to weight bear for pivot transfer with use of pad to assist.   Pt will benefit from skilled PT to increase their independence and safety with mobility to allow discharge to the venue listed below.     PT Assessment  Patient needs continued PT services    Follow Up Recommendations  CIR;Supervision/Assistance - 24 hour    Does the patient have the potential to tolerate intense rehabilitation    yes          Equipment Recommendations  Other (comment) (TBA)    Recommendations for Other Services Rehab consult   Frequency Min 3X/week    Precautions / Restrictions Precautions Precautions: Fall Restrictions Weight Bearing Restrictions: No   Pertinent Vitals/Pain VSS, No pain      Mobility  Bed Mobility Bed Mobility: Rolling Left;Left Sidelying to Sit Rolling Left: 3: Mod assist;With rail Left Sidelying to Sit: HOB elevated;1: +2 Total assist Left Sidelying to Sit: Patient Percentage: 40% Details for Bed Mobility Assistance: Pt needed assist for elevation of trunk and to move LEs off bed.  Slow to follow  commands.   Transfers Transfers: Sit to Stand;Stand to Sit;Squat Pivot Transfers Sit to Stand: 1: +2 Total assist;With upper extremity assist;With armrests;From elevated surface Sit to Stand: Patient Percentage: 50% Stand to Sit: 1: +2 Total assist;With upper extremity assist;To chair/3-in-1;With armrests Stand to Sit: Patient Percentage: 60% Squat Pivot Transfers: 1: +2 Total assist;With upper extremity assistance Squat Pivot Transfers: Patient Percentage: 50% Details for Transfer Assistance: Pt needed assist of pad to facilitate forward lean and hip extension.  Pt was able to bear full weight on LES but did have flexed knee stance.  Pivoted pt over to chair keeping pad in place to assist with facilitationof hips for transfer.  Also blocked bil knees for control.   Ambulation/Gait Ambulation/Gait Assistance: Not tested (comment) Stairs: No Wheelchair Mobility Wheelchair Mobility: No Modified Rankin (Stroke Patients Only) Pre-Morbid Rankin Score: Moderate disability Modified Rankin: Severe disability         PT Diagnosis: Generalized weakness  PT Problem List: Decreased activity tolerance;Decreased balance;Decreased mobility;Decreased strength;Decreased cognition;Decreased knowledge of use of DME;Decreased safety awareness;Decreased knowledge of precautions PT Treatment Interventions: DME instruction;Gait training;Functional mobility training;Therapeutic activities;Therapeutic exercise;Balance training;Neuromuscular re-education;Cognitive remediation;Patient/family education     PT Goals(Current goals can be found in the care plan section) Acute Rehab PT Goals Patient Stated Goal: to go home PT Goal Formulation: With patient Time For Goal Achievement: 12/17/12 Potential to Achieve Goals: Good  Visit Information  Last PT Received On: 12/03/12 Assistance Needed: +2       Prior Functioning  Home Living Family/patient expects to be discharged to:: Private  residence Living  Arrangements: Spouse/significant other Available Help at Discharge: Family;Available PRN/intermittently Type of Home: House Additional Comments: Difficult to assess as pt poor historian due to confusion Prior Function Comments: Unsure of prior functional status Communication Communication: No difficulties    Cognition  Cognition Arousal/Alertness: Awake/alert Behavior During Therapy: WFL for tasks assessed/performed Overall Cognitive Status: Impaired/Different from baseline Area of Impairment: Orientation;Attention;Memory;Following commands;Safety/judgement;Awareness;Problem solving Orientation Level: Disoriented to;Place;Time;Situation Current Attention Level: Focused Following Commands: Follows one step commands inconsistently;Follows one step commands with increased time Safety/Judgement: Decreased awareness of safety;Decreased awareness of deficits Problem Solving: Difficulty sequencing;Decreased initiation;Slow processing;Requires verbal cues;Requires tactile cues    Extremity/Trunk Assessment Upper Extremity Assessment Upper Extremity Assessment: Defer to OT evaluation Lower Extremity Assessment Lower Extremity Assessment: RLE deficits/detail;LLE deficits/detail RLE Deficits / Details: grossly2+/5 LLE Deficits / Details: grossly 3/5 Cervical / Trunk Assessment Cervical / Trunk Assessment: Kyphotic   Balance Balance Balance Assessed: Yes Static Sitting Balance Static Sitting - Balance Support: Left upper extremity supported;Feet supported Static Sitting - Level of Assistance: 1: +2 Total assist;Patient percentage (comment) (pt = 60%) Static Sitting - Comment/# of Minutes: 6  End of Session PT - End of Session Equipment Utilized During Treatment: Gait belt;Oxygen Activity Tolerance: Patient limited by fatigue Patient left: in chair;with call bell/phone within reach Nurse Communication: Mobility status;Need for lift equipment       INGOLD,Sheilia Reznick 12/03/2012, 3:51 PM [facility] Acute Rehabilitation [phone] (pager)

## 2012-12-03 NOTE — Progress Notes (Signed)
Rehab Admissions Coordinator Note:  Patient was screened by Trish Mage for appropriateness for an Inpatient Acute Rehab Consult.  At this time, an inpatient rehab consult has been ordered and was completed by Dr. Riley Kill.  I will follow this patient.  Trish Mage 12/03/2012, 3:59 PM  I can be reached at [phone].

## 2012-12-03 NOTE — Progress Notes (Signed)
PULMONARY  / CRITICAL CARE MEDICINE  Name: Bryan Lam MRN: [id number] DOB: 03-11-1928    ADMISSION DATE:  11/30/2012  REFERRING MD :  EDP PRIMARY SERVICE: PCCM  CHIEF COMPLAINT:  ICH  BRIEF PATIENT DESCRIPTION: 79 y/o M who presented with weakness and change in mental status to ER on 7/18.  Found to have CT with large amt of intraventricular blood.     SIGNIFICANT EVENTS / STUDIES:  7/18 CT Head>>>large amt of intraventricular blood on L, trace on R  LINES / TUBES:   CULTURES: none  ANTIBIOTICS: none  SUBJECTIVE/overnight:  Alert. + F/C. As of this AM, remained on low dose nicardipine  VITAL SIGNS: Temp:  [97.7 F (36.5 C)-99 F (37.2 C)] 98.1 F (36.7 C) (07/21 1601) Pulse Rate:  [42-119] 56 (07/21 1930) Resp:  [14-28] 18 (07/21 1930) BP: (132-172)/(49-108) 165/70 mmHg (07/21 1930) SpO2:  [85 %-100 %] 97 % (07/21 1930) Weight:  [88.1 kg (194 lb 3.6 oz)] 88.1 kg (194 lb 3.6 oz) (07/21 0432)  HEMODYNAMICS:    VENTILATOR SETTINGS:    INTAKE / OUTPUT: Intake/Output     07/21 0701 - 07/22 0700   P.O.    I.V. (mL/kg) 170 (1.9)   IV Piggyback    Total Intake(mL/kg) 170 (1.9)   Urine (mL/kg/hr) 565 (0.5)   Total Output 565   Net -395         PHYSICAL EXAMINATION: General: NAD Neuro: RASS 0, + F/C, MAEs  HEENT: WNL Cardiovascular: IRIR, rate controlled, no M Lungs: Clear Abdomen:  Round/soft, bsx4 active Ext: No edema, warm  LABS:  Recent Labs Lab 11/30/12 1535 12/01/12 0501 12/02/12 0230 12/03/12 0540  HGB 12.3* 12.0* 11.1* 11.6*  WBC 8.2 8.2 8.6 8.9  PLT 150 144* 135* 161  NA 135 133* 133* 132*  K 3.5 3.2* 3.6 3.6  CL 100 97 98 98  CO2 25 24 25 25   GLUCOSE 137* 133* 127* 128*  BUN 21 18 22  28*  CREATININE 0.93 0.80 0.83 0.83  CALCIUM 8.7 9.1 8.7 8.8  MG  --  1.9 2.0  --   PHOS  --  2.5 2.5  --   AST 13  --   --   --   ALT 9  --   --   --   ALKPHOS 65  --   --   --   BILITOT 0.8  --   --   --   PROT 7.0  --   --   --   ALBUMIN 3.4*   --   --   --   INR 1.54* 1.23  --  1.22    Recent Labs Lab 12/01/12 0745 12/01/12 1701  GLUCAP 121* 145*   CXR: CM, no edema  ASSESSMENT / PLAN:  NEUROLOGIC A:   Left Intraventricular Hemorrhage  P:   Mgmt per Stroke team   CARDIOVASCULAR A:  HTN Emergency Hx of AAA Repair Hx of CABG & Valve Replacement P:  PRN Hydralazine Wean nicardipine to off for SBP < 160 mmHg  RENAL A:  HypoMag. Hyponatremia - mild P:   Correct electrolytes as indicated  GASTROINTESTINAL A:   No issues P:   Cont current diet  HEMATOLOGIC A:   Coagulopathy due to warfarin P:  - Follow CBC Timing of restart of warfarin per Neuro  INFECTIOUS A:   No issues P:   Monitor off abx  ENDOCRINE A:   Gout P:   - Monitor - Holding allopurinol  David Simonds, MD ; PCCM service Mobile [phone].  After 5:30 PM or weekends, call [phone]  

## 2012-12-03 NOTE — Progress Notes (Signed)
Patient experiencing urinary retention.  Bladder scan performed at 2000 reading 326. No urge to void. E-link nurse notified. Received call back from E-Link MD to place foley catheter. Foley placed with no problems with 320 cc of yellow urine in immediate return.  Will continue to monitor patient. Bryan Lam

## 2012-12-03 NOTE — Consult Note (Signed)
Physical Medicine and Rehabilitation Consult  Reason for Consult: Left facial droop and left sided weakness Referring Physician: Dr. Sung Amabile.    HPI: Bryan Lam is a 79 y.o. male with history of hypertension, CAD s/p CABG, s/p valve replacement, atrial fibrillation on coumadin, s/p AAA repair. Patient with sudden onset of confusion, facial droop and left sided weakness on 11/30/12 and significant HTN with SBP in 200 range. CT head at [facility] with large left lateral intraventricular hemorrhage Chronic coumadin reversed and transferred to [facility] from [facility] for further management of large intraventricular blood centered primarily within the atria of the left lateral ventricle at the level of the choroid plexus. Neurology consulted and felt that hemorrhage secondary to combination of coumadin and HTN and conservative management recommended. Follow up CCT without significant change in hemorrhage or edema. Patient with fluctuating bouts of lethargy but mentation improving. Therapy evaluation to be done today. MD recommending CIR.   Review of Systems  Unable to perform ROS: language   Past Medical History  Diagnosis Date  . Hypertension   . AAA (abdominal aortic aneurysm)   . Gout   . Atrial fibrillation    Past Surgical History  Procedure Laterality Date  . Coronary artery bypass graft    . Cardiac valve replacement    . Abdominal aortic aneurysm repair  02/14   History reviewed. No pertinent family history.  Social History:  reports that he quit smoking about 50 years ago. He does not have any smokeless tobacco history on file. His alcohol and drug histories are not on file.  Allergies: No Known Allergies  Medications Prior to Admission  Medication Sig Dispense Refill  . allopurinol (ZYLOPRIM) 300 MG tablet Take 300 mg by mouth daily.      Marland Kitchen aspirin EC 81 MG tablet Take 81 mg by mouth daily.      . metoprolol succinate (TOPROL-XL) 25 MG 24 hr tablet Take 25 mg by mouth daily.       . Omega-3 Fatty Acids (FISH OIL PO) Take by mouth daily.      Marland Kitchen warfarin (COUMADIN) 1 MG tablet Take 4.5 mg by mouth daily.      . ramipril (ALTACE) 10 MG capsule Take 10 mg by mouth daily.        Home: Home Living Family/patient expects to be discharged to:: Private residence Living Arrangements: Spouse/significant other  Functional History:   Functional Status:  Mobility:          ADL:    Cognition: Cognition Orientation Level: Oriented to person;Disoriented to place;Disoriented to time;Disoriented to situation    Blood pressure 149/98, pulse 67, temperature 97.7 F (36.5 C), temperature source Oral, resp. rate 17, height 6' (1.829 m), weight 88.1 kg (194 lb 3.6 oz), SpO2 100.00%.  Physical Exam  Nursing note and vitals reviewed. Constitutional: He appears well-developed and well-nourished.  HENT:  Head: Normocephalic and atraumatic.  Eyes: Pupils are equal, round, and reactive to light.  Neck: Normal range of motion. Neck supple.  Cardiovascular: Normal rate.  An irregularly irregular rhythm present.  Pulmonary/Chest: Effort normal and breath sounds normal. No respiratory distress.  Abdominal: Soft. Bowel sounds are normal. He exhibits no distension. There is no tenderness.  Musculoskeletal: He exhibits no edema and no tenderness.  Neurological: He is alert.  Pleasant but oriented to self only. R-HH with right inattention. Delayed responses needing redirection and occasional cues to answer questions. Able to follow simple one step commands with occasional cues. Easily distracted. Poor awareness  of deficits. Moves both upper extremities inconsistently depending often on level of arousal. Told me he was married and in the hospital.  Skin: Skin is warm and dry.    Results for orders placed during the hospital encounter of 11/30/12 (from the past 24 hour(s))  CBC     Status: Abnormal   Collection Time    12/03/12  5:40 AM      Result Value Range   WBC 8.9  4.0 -  10.5 K/uL   RBC 4.57  4.22 - 5.81 MIL/uL   Hemoglobin 11.6 (*) 13.0 - 17.0 g/dL   HCT 16.1 (*) 09.6 - 04.5 %   MCV 79.0  78.0 - 100.0 fL   MCH 25.4 (*) 26.0 - 34.0 pg   MCHC 32.1  30.0 - 36.0 g/dL   RDW 40.9 (*) 81.1 - 91.4 %   Platelets 161  150 - 400 K/uL  PROTIME-INR     Status: None   Collection Time    12/03/12  5:40 AM      Result Value Range   Prothrombin Time 15.1  11.6 - 15.2 seconds   INR 1.22  0.00 - 1.49  BASIC METABOLIC PANEL     Status: Abnormal   Collection Time    12/03/12  5:40 AM      Result Value Range   Sodium 132 (*) 135 - 145 mEq/L   Potassium 3.6  3.5 - 5.1 mEq/L   Chloride 98  96 - 112 mEq/L   CO2 25  19 - 32 mEq/L   Glucose, Bld 128 (*) 70 - 99 mg/dL   BUN 28 (*) 6 - 23 mg/dL   Creatinine, Ser 7.82  0.50 - 1.35 mg/dL   Calcium 8.8  8.4 - 95.6 mg/dL   GFR calc non Af Amer 79 (*) >90 mL/min   GFR calc Af Amer >90  >90 mL/min   Ct Head Wo Contrast  12/01/2012   *RADIOLOGY REPORT*  Clinical Data: 79 year old male with intracranial hemorrhage.  CT HEAD WITHOUT CONTRAST  Technique:  Contiguous axial images were obtained from the base of the skull through the vertex without contrast.  Comparison: 12/01/2012 and earlier.  Findings: Study is mildly degraded by motion artifact despite repeated imaging attempts.  Stable visualized osseous structures.  Stable paranasal sinuses and mastoids.  Stable orbit and scalp soft tissues.  Confluent left lateral intraventricular hemorrhage, volume not significantly changed (up to 69 x 26 mm transaxially).  Extension of hemorrhage into the other ventricles likewise not significantly changed.  As before, evidence of some trapping of the left lateral ventricle, with enlargement of the left temporal horn.  No midline shift.  Left hemisphere cerebral edema not significantly changed.  No superimposed acute cortically based infarct.  Stable gray-white matter differentiation elsewhere.  Basilar cisterns remain patent. Stable appearance of  the intracranial vascular structures with a degree of dolichoectasia.  IMPRESSION:  Stable.  Intraventricular hemorrhage centered at the left lateral ventricle, with some degree of trapping of that ventricle, and left hemisphere edema.  No new intracranial abnormality.   Original Report Authenticated By: Erskine Speed, M.D.   Dg Chest Port 1 View  12/03/2012   *RADIOLOGY REPORT*  Clinical Data: Respiratory failure.  PORTABLE CHEST - 1 VIEW  Comparison: Chest x-ray 12/01/2012.  Findings: Lung volumes are slightly low.  There is some bibasilar opacities favored to predominately reflect subsegmental atelectasis.  No definite acute consolidative airspace disease. Mild cephalization of the pulmonary vasculature without frank pulmonary  edema.  No pleural effusions.  Heart size is moderately enlarged.  Mediastinal contours are distorted by low lung volumes and patient rotation.  Atherosclerosis in the thoracic aorta. Status post median sternotomy for CABG.  IMPRESSION: 1.  Moderate cardiomegaly with pulmonary venous congestion, but no frank pulmonary edema. 2.  Low lung volumes with minimal bibasilar subsegmental atelectasis. 3.  Atherosclerosis.   Original Report Authenticated By: Trudie Reed, M.D.    Assessment/Plan: Diagnosis: left IVH 1. Does the need for close, 24 hr/day medical supervision in concert with the patient's rehab needs make it unreasonable for this patient to be served in a less intensive setting? Yes 2. Co-Morbidities requiring supervision/potential complications: acute respiratory failure, htn 3. Due to bladder management, bowel management, safety, skin/wound care, disease management, medication administration, pain management and patient education, does the patient require 24 hr/day rehab nursing? Yes 4. Does the patient require coordinated care of a physician, rehab nurse, PT (1-2 hrs/day, 5 days/week), OT (1-2 hrs/day, 5 days/week) and SLP (1-2 hrs/day, 5 days/week) to address physical  and functional deficits in the context of the above medical diagnosis(es)? Yes Addressing deficits in the following areas: balance, endurance, locomotion, strength, transferring, bowel/bladder control, bathing, dressing, feeding, grooming, toileting, cognition, speech, language, swallowing and psychosocial support 5. Can the patient actively participate in an intensive therapy program of at least 3 hrs of therapy per day at least 5 days per week? Yes and Potentially 6. The potential for patient to make measurable gains while on inpatient rehab is excellent 7. Anticipated functional outcomes upon discharge from inpatient rehab are supervision to minimal assist with PT, supervision to minimal assist with OT, supervision to minimal assist with SLP. 8. Estimated rehab length of stay to reach the above functional goals is: 2-3 weeks 9. Does the patient have adequate social supports to accommodate these discharge functional goals? Yes and Potentially 10. Anticipated D/C setting: Home 11. Anticipated post D/C treatments: HH therapy 12. Overall Rehab/Functional Prognosis: excellent  RECOMMENDATIONS: This patient's condition is appropriate for continued rehabilitative care in the following setting: CIR Patient has agreed to participate in recommended program. Potentially Note that insurance prior authorization may be required for reimbursement for recommended care.  Comment: Rehab RN to follow up.   Ranelle Oyster, MD, [place] [facility]     12/03/2012

## 2012-12-03 NOTE — Progress Notes (Signed)
Patient is more lethargic and no longer following commands. Patient not oriented to self as he was previously.  Repeated stimulation with no improvement.  Dr. Thad Ranger paged and made aware of change.  Stat head CT ordered. Will continue to monitor. Jacqulynn Cadet

## 2012-12-04 ENCOUNTER — Inpatient Hospital Stay (HOSPITAL_COMMUNITY): Payer: Medicare HMO

## 2012-12-04 DIAGNOSIS — I10 Essential (primary) hypertension: Secondary | ICD-10-CM

## 2012-12-04 DIAGNOSIS — I4891 Unspecified atrial fibrillation: Secondary | ICD-10-CM

## 2012-12-04 LAB — COMPREHENSIVE METABOLIC PANEL
AST: 11 U/L (ref 0–37)
Albumin: 3.1 g/dL — ABNORMAL LOW (ref 3.5–5.2)
BUN: 44 mg/dL — ABNORMAL HIGH (ref 6–23)
Chloride: 100 mEq/L (ref 96–112)
Creatinine, Ser: 0.89 mg/dL (ref 0.50–1.35)
Potassium: 3.7 mEq/L (ref 3.5–5.1)
Total Protein: 6.8 g/dL (ref 6.0–8.3)

## 2012-12-04 LAB — URINALYSIS, ROUTINE W REFLEX MICROSCOPIC
Glucose, UA: NEGATIVE mg/dL
Ketones, ur: NEGATIVE mg/dL
Protein, ur: 30 mg/dL — AB

## 2012-12-04 LAB — URINE MICROSCOPIC-ADD ON

## 2012-12-04 LAB — CBC
HCT: 36.6 % — ABNORMAL LOW (ref 39.0–52.0)
MCHC: 33.3 g/dL (ref 30.0–36.0)
MCV: 78.7 fL (ref 78.0–100.0)
Platelets: 151 10*3/uL (ref 150–400)
RDW: 18.1 % — ABNORMAL HIGH (ref 11.5–15.5)
WBC: 10.6 10*3/uL — ABNORMAL HIGH (ref 4.0–10.5)

## 2012-12-04 MED ORDER — BIOTENE DRY MOUTH MT LIQD
15.0000 mL | Freq: Two times a day (BID) | OROMUCOSAL | Status: DC
  Administered 2012-12-05 – 2012-12-08 (×8): 15 mL via OROMUCOSAL

## 2012-12-04 MED ORDER — SODIUM CHLORIDE 0.9 % IV SOLN: 250.0000 mL | INTRAVENOUS | Status: DC | PRN

## 2012-12-04 MED ORDER — HYDRALAZINE HCL 20 MG/ML IJ SOLN
10.0000 mg | INTRAMUSCULAR | Status: DC | PRN
  Administered 2012-12-04 – 2012-12-05 (×2): 20 mg via INTRAVENOUS
  Filled 2012-12-04: qty 2
  Filled 2012-12-04: qty 1

## 2012-12-04 MED ORDER — ACETAMINOPHEN 650 MG RE SUPP
650.0000 mg | Freq: Four times a day (QID) | RECTAL | Status: DC | PRN
  Administered 2012-12-04 – 2012-12-09 (×5): 650 mg via RECTAL
  Filled 2012-12-04 (×5): qty 1

## 2012-12-04 MED ORDER — ACETAMINOPHEN 325 MG PO TABS: 650.0000 mg | ORAL_TABLET | Freq: Four times a day (QID) | ORAL | Status: DC | PRN

## 2012-12-04 NOTE — Progress Notes (Signed)
Stroke Team Progress Note  HISTORY 79 y.o. male with a past medical history significant for hypertension, CAD s/p CABG, s/p valve replacement, atrial fibrillation on coumadin, s/p AAA repair, transferred to [facility] from [facility] for further management of large intraventricular hemorrhage. Was very functional at home, and patient's wife called his son around 3 am today 11/30/2012 because he was feeling weak and was looking confused. Family report that then they noted that his left face was droopier than the right and the left arm was also weak. Family stated that he is getting more confused. Patient was taken to [facility] and had a CT brain that revealed a large Large amount of interventricular blood centered primarily within the atria of the left lateral ventricle at the level of the choroid plexus. There is minimal dilatation of the temporal horn of the left lateral ventricle and associated minimal (approximately 4 mm) of left to right midline shift. Trace amount of intraventricular blood is seen within the dependent portion of the right lateral ventricle. Family stated that he is getting more confused."  He was admitted to the neuro ICU for further evaluation and treatment.  SUBJECTIVE His wife and daughter are at the bedside.  Overall he feels his condition is gradually improving.   OBJECTIVE Most recent Vital Signs: Filed Vitals:   12/04/12 0400 12/04/12 0500 12/04/12 0600 12/04/12 0700  BP: 126/73 146/88 119/68 164/93  Pulse: 49 75 68 37  Temp:      TempSrc:      Resp: 21 21 20 22   Height:      Weight:      SpO2: 96% 95% 96% 97%   CBG (last 3)   Recent Labs  12/01/12 1701  GLUCAP 145*    IV Fluid Intake:      MEDICATIONS  . Chlorhexidine Gluconate Cloth  6 each Topical Q0600  . metoprolol succinate  25 mg Oral Daily  . mupirocin ointment  1 application Nasal BID  . ramipril  10 mg Oral Daily   PRN:  sodium chloride, hydrALAZINE  Diet:  Cardiac thin  liquids Activity:  Bedrest DVT Prophylaxis:  SCDs   CLINICALLY SIGNIFICANT STUDIES Basic Metabolic Panel:   Recent Labs Lab 12/01/12 0501 12/02/12 0230 12/03/12 0540  NA 133* 133* 132*  K 3.2* 3.6 3.6  CL 97 98 98  CO2 24 25 25   GLUCOSE 133* 127* 128*  BUN 18 22 28*  CREATININE 0.80 0.83 0.83  CALCIUM 9.1 8.7 8.8  MG 1.9 2.0  --   PHOS 2.5 2.5  --    Liver Function Tests:   Recent Labs Lab 11/30/12 1535  AST 13  ALT 9  ALKPHOS 65  BILITOT 0.8  PROT 7.0  ALBUMIN 3.4*   CBC:  Recent Labs Lab 11/30/12 1535  12/02/12 0230 12/03/12 0540  WBC 8.2  < > 8.6 8.9  NEUTROABS 6.4  --   --   --   HGB 12.3*  < > 11.1* 11.6*  HCT 36.0*  < > 34.2* 36.1*  MCV 77.8*  < > 77.9* 79.0  PLT 150  < > 135* 161  < > = values in this interval not displayed. Coagulation:   Recent Labs Lab 11/30/12 1535 12/01/12 0501 12/03/12 0540  LABPROT 18.1* 15.2 15.1  INR 1.54* 1.23 1.22   Cardiac Enzymes: No results found for this basename: CKTOTAL, CKMB, CKMBINDEX, TROPONINI,  in the last 168 hours Urinalysis:   Recent Labs Lab 11/30/12 1542  COLORURINE AMBER*  LABSPEC 1.016  PHURINE 7.0  GLUCOSEU NEGATIVE  HGBUR MODERATE*  BILIRUBINUR NEGATIVE  KETONESUR 15*  PROTEINUR 30*  UROBILINOGEN 0.2  NITRITE NEGATIVE  LEUKOCYTESUR NEGATIVE   Lipid Panel No results found for this basename: chol,  trig,  hdl,  cholhdl,  vldl,  ldlcalc   HgbA1C  No results found for this basename: HGBA1C   Urine Drug Screen:   No results found for this basename: labopia,  cocainscrnur,  labbenz,  amphetmu,  thcu,  labbarb    Alcohol Level: No results found for this basename: ETH,  in the last 168 hours   CT of the brain   12/03/2012 Slight interval decrease in size of left ventricular hemorrhage with less dilatation of the temporal horn of the left lateral  ventricle. No new intracranial hemorrhage or infarct identified. No midline shift 12/01/2012    Stable.  Intraventricular hemorrhage  centered at the left lateral ventricle, with some degree of trapping of that ventricle, and left hemisphere edema.  No new intracranial abnormality.     12/01/2012   Stable size and appearance of predominately left-sided intraventricular hemorrhage with stable to slightly increased prominence of the lateral ventricles as compared to the prior study.    11/30/2012     1.  Large amount of interventricular blood centered primarily within the atria of the left lateral ventricle at the level of the choroid plexus. The etiology of this hemorrhage is indeterminate but could be the sequela of trauma, hypercoagulable state and/or choroid plexus vascular malformation. 2.  The amount of left-sided intraventricular blood results in minimal dilatation of the temporal horn of the left lateral ventricle and associated minimal (approximately 4 mm) of left to right midline shift.  3.  Trace amount of intraventricular blood is seen within the dependent portion of the right lateral ventricle.    MRI of the brain    MRA of the brain    2D Echocardiogram    Carotid Doppler    CXR   12/03/2012    1.  Moderate cardiomegaly with pulmonary venous congestion, but no frank pulmonary edema. 2.  Low lung volumes with minimal bibasilar subsegmental atelectasis. 3.  Atherosclerosis.  12/01/2012    No evidence of acute infiltrate.    11/30/2012   1.  Cardiac enlargement. 2.  Suspect left pleural effusion. 3.  Infiltrate versus atelectasis in the left base.    EKG     Therapy Recommendations   GENERAL EXAM:  Patient is in no distress  CARDIOVASCULAR:  Regular rate and rhythm, no murmurs, no carotid bruits  NEUROLOGIC:  MENTAL STATUS: ASLEEP BUT WAKES TO VOICE. SAYS SOME WORDS. SLOW RESPONSES. FOLLOWS COMMANDS INTERMITTENTLY.  CRANIAL NERVE: pupils (R 2.5, L 2, POST SURGICAL, MILD RXN), visual fields full to confrontation, extraocular muscles intact, no nystagmus, facial sensation and strength symmetric, uvula midline, shoulder  shrug symmetric, tongue midline.  MOTOR: RUE 2, LUE 3, RLE 1, LLE 3.  SENSORY: normal and symmetric to light touch   ASSESSMENT [name] is a 79 y.o. male presenting with left sided  weakness and confusion. Imaging confirms a left lateral intraventricular hemorrhage. Hemorrhage felt to be secondary to combination of Hypertension and coumadin use.  On aspirin 81 mg orally every day and warfarin prior to admission. Now on no antithrombotics due to hemorrhage for secondary stroke prevention. Patient with resultant right field cut, right  hemiparesis. Work up completed.,  atrial fibrillation, on chronic coumadin S/p valve replacement, on chronic coumadin Hypertension S/p AAA  repair CAD - s/p CABG No dementia at baseline  Hospital day # 4  TREATMENT/PLAN  Continue home BP medications, goal is SBP 180  Timing of coumadin, start aspirin first, then wait 2-4 weeks to restart coumadin if hemorrhage stable  Rehab consulted  Transfer to the floor  Dr. Pearlean Brownie discussed diagnosis, prognosis,  treatment options and plan of care with  Pt, wife, dtr and Dr. Bard Herbert.  I have personally obtained a history, examined the patient, evaluated imaging results, and formulated the assessment and plan of care. I agree with the above. Delia Heady, MD

## 2012-12-04 NOTE — Progress Notes (Signed)
PULMONARY  / CRITICAL CARE MEDICINE  Name: Bryan Lam MRN: [id number] DOB: May 12, 1928    ADMISSION DATE:  11/30/2012  REFERRING MD :  EDP PRIMARY SERVICE: PCCM  CHIEF COMPLAINT:  ICH  BRIEF PATIENT DESCRIPTION: 79 y/o M who presented with weakness and change in mental status to ER on 7/18.  Found to have CT with large amt of intraventricular blood.     SIGNIFICANT EVENTS / STUDIES:  7/18 CT Head>>>large amt of intraventricular blood on L, trace on R 7/21 Weaned off nicardipine infusion 7/21 CT head: Slight interval decrease in size of left ventricular hemorrhage with less dilatation of the temporal horn of the left lateral  ventricle. No new intracranial hemorrhage or infarct identified. No midline shift. 7/22 More lethargic. SBP goal changed to 120 - 180 mmHg per Stroke Team   LINES / TUBES:   CULTURES: none  ANTIBIOTICS: none  SUBJECTIVE/overnight:  More lethargic. No distress  VITAL SIGNS: Temp:  [97.8 F (36.6 C)-99.4 F (37.4 C)] 98.7 F (37.1 C) (07/22 1230) Pulse Rate:  [37-119] 69 (07/22 1400) Resp:  [15-27] 22 (07/22 1400) BP: (116-186)/(49-108) 182/75 mmHg (07/22 1400) SpO2:  [94 %-99 %] 95 % (07/22 1400)  HEMODYNAMICS:    VENTILATOR SETTINGS:    INTAKE / OUTPUT: Intake/Output     07/21 0701 - 07/22 0700 07/22 0701 - 07/23 0700   P.O. 30    I.V. (mL/kg) 280 (3.2) 80 (0.9)   IV Piggyback     Total Intake(mL/kg) 310 (3.5) 80 (0.9)   Urine (mL/kg/hr) 1200 (0.6) 100 (0.1)   Total Output 1200 100   Net -890 -20          PHYSICAL EXAMINATION: General: NAD Neuro: RASS -2, + F/C, MAEs  HEENT: WNL Cardiovascular: IRIR, rate controlled, no M Lungs: Clear Abdomen:  Round/soft, bsx4 active Ext: No edema, warm  LABS:  Recent Labs Lab 11/30/12 1535 12/01/12 0501 12/02/12 0230 12/03/12 0540  HGB 12.3* 12.0* 11.1* 11.6*  WBC 8.2 8.2 8.6 8.9  PLT 150 144* 135* 161  NA 135 133* 133* 132*  K 3.5 3.2* 3.6 3.6  CL 100 97 98 98  CO2 25 24 25 25    GLUCOSE 137* 133* 127* 128*  BUN 21 18 22  28*  CREATININE 0.93 0.80 0.83 0.83  CALCIUM 8.7 9.1 8.7 8.8  MG  --  1.9 2.0  --   PHOS  --  2.5 2.5  --   AST 13  --   --   --   ALT 9  --   --   --   ALKPHOS 65  --   --   --   BILITOT 0.8  --   --   --   PROT 7.0  --   --   --   ALBUMIN 3.4*  --   --   --   INR 1.54* 1.23  --  1.22    Recent Labs Lab 12/01/12 0745 12/01/12 1701  GLUCAP 121* 145*   CXR: CM, no edema  ASSESSMENT / PLAN:  NEUROLOGIC A:   Left Intraventricular Hemorrhage  P:   Mgmt per Stroke team   CARDIOVASCULAR A:  HTN Emergency Hx of AAA Repair Hx of CABG & Valve Replacement P:  New BP goal 120 - 180 mmHg  RENAL A:  HypoMag. Hyponatremia - mild P:   Correct electrolytes as indicated  GASTROINTESTINAL A:   No issues P:   Cont current diet  HEMATOLOGIC A:   Coagulopathy  due to warfarin P:  Follow CBC Timing of restart of warfarin per Neuro  INFECTIOUS A:   No issues P:   Monitor off abx  ENDOCRINE A:   Gout P:   Monitor Holding allopurinol   Billy Fischer, MD ; [facility] service Mobile [phone].  After 5:30 PM or weekends, call [phone]

## 2012-12-04 NOTE — Progress Notes (Signed)
Physical Therapy Treatment Patient Details Name: Bryan Lam MRN: [id number] DOB: [birth year] Today's Date: 12/04/2012 Time: 6213-0865 PT Time Calculation (min): 14 min  PT Assessment / Plan / Recommendation  History of Present Illness 79 y.o. male with a past medical history significant for hypertension, CAD s/p CABG, s/p valve replacement, atrial fibrillation on coumadin, s/p AAA repair, transferred to [facility] for further management of large left intraventricular hemorrhage. Pt less responsive today.   Clinical Impression Pt lethargic and unresponsive throughout treatment.   PT Comments     Follow Up Recommendations  CIR     Does the patient have the potential to tolerate intense rehabilitation     Barriers to Discharge        Equipment Recommendations  Other (comment) (TBA)    Recommendations for Other Services    Frequency Min 3X/week   Progress towards PT Goals Progress towards PT goals: Not progressing toward goals - comment (lethargic)  Plan Current plan remains appropriate    Precautions / Restrictions Precautions Precautions: Fall Restrictions Weight Bearing Restrictions: No   Pertinent Vitals/Pain VSS    Mobility  Bed Mobility Bed Mobility: Supine to Sit;Sitting - Scoot to [place] of Bed;Sit to Supine;Scooting to [facility] Supine to Sit: 1: +2 Total assist Supine to Sit: Patient Percentage: 0% Sitting - Scoot to Edge of Bed: 1: +2 Total assist Sitting - Scoot to Edge of Bed: Patient Percentage: 0% Sit to Supine: 1: +2 Total assist Sit to Supine: Patient Percentage: 0% Scooting to HOB: 1: +2 Total assist Scooting to [facility]: Patient Percentage: 0% Details for Bed Mobility Assistance: Pt lethargic and didn't arouse with any movement. Modified Rankin (Stroke Patients Only) Pre-Morbid Rankin Score: Moderate disability Modified Rankin: Severe disability    Exercises     PT Diagnosis:    PT Problem List:   PT Treatment Interventions:     PT Goals (current goals can now be  found in the care plan section)    Visit Information  Last PT Received On: 12/04/12 Assistance Needed: +2 History of Present Illness: 79 y.o. male with a past medical history significant for hypertension, CAD s/p CABG, s/p valve replacement, atrial fibrillation on coumadin, s/p AAA repair, transferred to [facility] for further management of large left intraventricular hemorrhage. Pt less responsive today.    Subjective Data      Cognition  Cognition Arousal/Alertness: Lethargic Overall Cognitive Status: Difficult to assess Difficult to assess due to: Level of arousal    Balance  Static Sitting Balance Static Sitting - Balance Support: No upper extremity supported Static Sitting - Level of Assistance: 1: +2 Total assist (pt = 0%) Static Sitting - Comment/# of Minutes: Sat pt on EOB x 5 minutes to try and arouse pt.  Pt without response.  Tried cold washcloth to wipe face and still didn't respond.  End of Session PT - End of Session Equipment Utilized During Treatment: Oxygen Activity Tolerance: Patient limited by lethargy Patient left: in bed;with call bell/phone within reach;with family/visitor present Nurse Communication: Mobility status;Need for lift equipment   GP     Lotoya Casella 12/04/2012, 12:21 PM  [facility] PT [phone]

## 2012-12-04 NOTE — Plan of Care (Signed)
Problem: Phase I Progression Outcomes Goal: BP within ordered parameters Outcome: Progressing Parameter liberalized to SBP < 180

## 2012-12-04 NOTE — Progress Notes (Signed)
Rehab admissions - Evaluated for possible admission.  I spoke with wife, son, grandson at the bedside.  Noted patient more lethargic and less responsive today.  Noted plans for CT head.  I have called and opened the case with [employer].  Will await medical readiness prior to pursuing acute inpatient rehab admission.  Call me for questions.  #[phone]

## 2012-12-04 NOTE — Plan of Care (Signed)
Problem: Phase I Progression Outcomes Goal: Neuro exam at baseline or improved Outcome: Not Progressing Continues w/ waxing & waning mental status, incr lethargy

## 2012-12-04 NOTE — Progress Notes (Signed)
Alveda Reasons, PA-C notified of incr temp 101.2 and decr responsiveness.  Tylenol ordered, plan to monitor pt and repeat head CT if pt continues w/ decr responsiveness.

## 2012-12-05 ENCOUNTER — Inpatient Hospital Stay (HOSPITAL_COMMUNITY): Payer: Medicare HMO

## 2012-12-05 ENCOUNTER — Encounter (HOSPITAL_COMMUNITY): Payer: Self-pay | Admitting: Radiology

## 2012-12-05 DIAGNOSIS — R400 Somnolence: Secondary | ICD-10-CM

## 2012-12-05 LAB — CBC
HCT: 36.2 % — ABNORMAL LOW (ref 39.0–52.0)
Hemoglobin: 11.8 g/dL — ABNORMAL LOW (ref 13.0–17.0)
MCH: 25.8 pg — ABNORMAL LOW (ref 26.0–34.0)
MCHC: 32.6 g/dL (ref 30.0–36.0)
MCV: 79.2 fL (ref 78.0–100.0)
RBC: 4.57 MIL/uL (ref 4.22–5.81)

## 2012-12-05 LAB — BASIC METABOLIC PANEL
BUN: 47 mg/dL — ABNORMAL HIGH (ref 6–23)
CO2: 26 mEq/L (ref 19–32)
Calcium: 9.3 mg/dL (ref 8.4–10.5)
Creatinine, Ser: 0.96 mg/dL (ref 0.50–1.35)
Glucose, Bld: 125 mg/dL — ABNORMAL HIGH (ref 70–99)

## 2012-12-05 MED ORDER — HYDRALAZINE HCL 20 MG/ML IJ SOLN
10.0000 mg | INTRAMUSCULAR | Status: DC | PRN
  Administered 2012-12-05 – 2012-12-06 (×2): 20 mg via INTRAVENOUS
  Filled 2012-12-05 (×2): qty 1

## 2012-12-05 NOTE — Progress Notes (Signed)
Rehab admissions - Patient still very lethargic and less responsive.  Observed up in chair, but very drowsy.  Not ready for acute inpatient rehab admission yet.  Call me for questions.  #[phone]

## 2012-12-05 NOTE — Consult Note (Signed)
Reason for Consult: Left basal ganglia hemorrhage with interventricular extension Referring Physician: Dr. Lance Muss stroke service  Bryan Lam is an 79 y.o. male.  HPI: Patient is an 79 year old gentleman is admitted hospital last Friday with altered mental status and difficulty ambulating workup revealed a left posterior basal ganglia hemorrhagic stroke with interventricular extension. Patient admitted to the critical care and stroke service has been worked up and walk observed over last several days with altered mental status confusion difficulty with ambulation. Followup CT scan has shown stable hemorrhage with interventricular extension and we have been consult to evaluate for hydrocephalus and possible external ventricular drainage.  Past Medical History  Diagnosis Date  . Hypertension   . AAA (abdominal aortic aneurysm)   . Gout   . Atrial fibrillation     Past Surgical History  Procedure Laterality Date  . Coronary artery bypass graft    . Cardiac valve replacement    . Abdominal aortic aneurysm repair  02/14    History reviewed. No pertinent family history.  Social History:  reports that he quit smoking about 50 years ago. He does not have any smokeless tobacco history on file. His alcohol and drug histories are not on file.  Allergies: No Known Allergies  Medications: I have reviewed the patient's current medications.  Results for orders placed during the hospital encounter of 11/30/12 (from the past 48 hour(s))  CBC     Status: Abnormal   Collection Time    12/04/12  4:44 PM      Result Value Range   WBC 10.6 (*) 4.0 - 10.5 K/uL   RBC 4.65  4.22 - 5.81 MIL/uL   Hemoglobin 12.2 (*) 13.0 - 17.0 g/dL   HCT 16.1 (*) 09.6 - 04.5 %   MCV 78.7  78.0 - 100.0 fL   MCH 26.2  26.0 - 34.0 pg   MCHC 33.3  30.0 - 36.0 g/dL   RDW 40.9 (*) 81.1 - 91.4 %   Platelets 151  150 - 400 K/uL  COMPREHENSIVE METABOLIC PANEL     Status: Abnormal   Collection Time    12/04/12  4:44 PM       Result Value Range   Sodium 136  135 - 145 mEq/L   Potassium 3.7  3.5 - 5.1 mEq/L   Chloride 100  96 - 112 mEq/L   CO2 25  19 - 32 mEq/L   Glucose, Bld 133 (*) 70 - 99 mg/dL   BUN 44 (*) 6 - 23 mg/dL   Comment: REPEATED TO VERIFY     DELTA CHECK NOTED   Creatinine, Ser 0.89  0.50 - 1.35 mg/dL   Calcium 9.0  8.4 - 78.2 mg/dL   Total Protein 6.8  6.0 - 8.3 g/dL   Albumin 3.1 (*) 3.5 - 5.2 g/dL   AST 11  0 - 37 U/L   ALT 9  0 - 53 U/L   Alkaline Phosphatase 56  39 - 117 U/L   Total Bilirubin 1.1  0.3 - 1.2 mg/dL   GFR calc non Af Amer 77 (*) >90 mL/min   GFR calc Af Amer 89 (*) >90 mL/min   Comment:            The eGFR has been calculated     using the CKD EPI equation.     This calculation has not been     validated in all clinical     situations.     eGFR's persistently     <  90 mL/min signify     possible Chronic Kidney Disease.  URINALYSIS, ROUTINE W REFLEX MICROSCOPIC     Status: Abnormal   Collection Time    12/04/12  6:45 PM      Result Value Range   Color, Urine AMBER (*) YELLOW   Comment: BIOCHEMICALS MAY BE AFFECTED BY COLOR   APPearance CLEAR  CLEAR   Specific Gravity, Urine 1.022  1.005 - 1.030   pH 5.5  5.0 - 8.0   Glucose, UA NEGATIVE  NEGATIVE mg/dL   Hgb urine dipstick SMALL (*) NEGATIVE   Bilirubin Urine SMALL (*) NEGATIVE   Ketones, ur NEGATIVE  NEGATIVE mg/dL   Protein, ur 30 (*) NEGATIVE mg/dL   Urobilinogen, UA 1.0  0.0 - 1.0 mg/dL   Nitrite NEGATIVE  NEGATIVE   Leukocytes, UA SMALL (*) NEGATIVE  URINE MICROSCOPIC-ADD ON     Status: None   Collection Time    12/04/12  6:45 PM      Result Value Range   Squamous Epithelial / LPF RARE  RARE   WBC, UA 3-6  <3 WBC/hpf   RBC / HPF 3-6  <3 RBC/hpf   Bacteria, UA RARE  RARE  CBC     Status: Abnormal   Collection Time    12/05/12  3:55 AM      Result Value Range   WBC 10.6 (*) 4.0 - 10.5 K/uL   RBC 4.57  4.22 - 5.81 MIL/uL   Hemoglobin 11.8 (*) 13.0 - 17.0 g/dL   HCT 16.1 (*) 09.6 - 04.5 %   MCV  79.2  78.0 - 100.0 fL   MCH 25.8 (*) 26.0 - 34.0 pg   MCHC 32.6  30.0 - 36.0 g/dL   RDW 40.9 (*) 81.1 - 91.4 %   Platelets 146 (*) 150 - 400 K/uL  BASIC METABOLIC PANEL     Status: Abnormal   Collection Time    12/05/12  3:55 AM      Result Value Range   Sodium 139  135 - 145 mEq/L   Potassium 3.7  3.5 - 5.1 mEq/L   Chloride 102  96 - 112 mEq/L   CO2 26  19 - 32 mEq/L   Glucose, Bld 125 (*) 70 - 99 mg/dL   BUN 47 (*) 6 - 23 mg/dL   Creatinine, Ser 7.82  0.50 - 1.35 mg/dL   Calcium 9.3  8.4 - 95.6 mg/dL   GFR calc non Af Amer 75 (*) >90 mL/min   GFR calc Af Amer 86 (*) >90 mL/min   Comment:            The eGFR has been calculated     using the CKD EPI equation.     This calculation has not been     validated in all clinical     situations.     eGFR's persistently     <90 mL/min signify     possible Chronic Kidney Disease.    Ct Head Wo Contrast  12/05/2012   *RADIOLOGY REPORT*  Clinical Data: Follow-up intracranial hemorrhage.  CT HEAD WITHOUT CONTRAST  Technique:  Contiguous axial images were obtained from the base of the skull through the vertex without contrast.  Comparison: 12/03/2012  Findings: Acute intraventricular hemorrhage in the left lateral ventricle with less prominent hemorrhage in the right lateral ventricle.  Left lateral ventricular hemorrhage measures about 5.9 x 2.8 cm diameter, similar to previous study.  Mild ventricular dilatation without change.  No new  hemorrhage identified.  Low attenuation change in the left periventricular white matter consistent with edema is stable.  No abnormal extra-axial fluid collections.  Basal cisterns are not effaced.  Gray-white matter junctions are distinct.  No depressed skull fractures.  Visualized paranasal sinuses and mastoid air cells are not opacified.  IMPRESSION: Stable appearance of large left and small right intraventricular hemorrhage.  Stable low attenuation edema in the left periventricular white matter.  No significant  change since previous study.   Original Report Authenticated By: Burman Nieves, M.D.   Ct Head Wo Contrast  12/03/2012   *RADIOLOGY REPORT*  Clinical Data: Intraventricular hemorrhage, decreased mental status  CT HEAD WITHOUT CONTRAST  Technique:  Contiguous axial images were obtained from the base of the skull through the vertex without contrast.  Comparison: Prior CT from 12/01/2012  Findings: Previously identified intraventricular hemorrhage centered in the left lateral ventricle is slightly decreased in size now measuring 6.1 x 2.5 cm. There is no significant midline shift.  The left temporal horn of the left lateral ventricle is less dilated as compared to the prior exam.  Blood within the right lateral ventricle is similar.  No increase in hydrocephalus.  No new hemorrhage or infarct identified. Edema within the left lateral ventricle is similar. Dolichoectasia of the intracranial circulation is again noted.  Calvarium and soft tissues are unremarkable.  Paranasal sinuses and mastoid air cells are clear.  IMPRESSION: Slight interval decrease in size of left ventricular hemorrhage with less dilatation of the temporal horn of the left lateral ventricle.  No new intracranial hemorrhage or infarct identified. No midline shift.   Original Report Authenticated By: Rise Mu, M.D.   Dg Chest Port 1 View  12/04/2012   *RADIOLOGY REPORT*  Clinical Data: Fever.  Lethargy.  Coronary artery disease.  PORTABLE CHEST - 1 VIEW  Comparison: 12/03/2012  Findings: Moderate to severe cardiomegaly is unchanged.  Ectasia thoracic aorta is stable.  Prior CABG again noted.  Elevation of left hemidiaphragm is unchanged.  Both lungs remain clear.  IMPRESSION: Stable cardiomegaly.  No acute findings.   Original Report Authenticated By: Myles Rosenthal, M.D.    Review of Systems  Unable to perform ROS  Blood pressure 163/77, pulse 50, temperature 98 F (36.7 C), temperature source Oral, resp. rate 18, height 6'  (1.829 m), weight 88.1 kg (194 lb 3.6 oz), SpO2 93.00%. Physical Exam  Constitutional: He appears listless. He appears distressed.  HENT:  Head: Normocephalic and atraumatic.  Eyes: Pupils are equal, round, and reactive to light.  GI: Soft.  Neurological: He appears listless. GCS eye subscore is 4. GCS verbal subscore is 1. GCS motor subscore is 4.  Obtunded confused Will open his eyes to voice will move his upper extremity  better than his lower extremities appears to be symmetric although very difficult to assess strength    Assessment/Plan: 79 year old woman admitted with mental status changes weakness confusion and a hemorrhagic cerebral infarction with intraventricular extension. Patient CT scans appear to be unstable with slight resolution of his interventricular hemorrhage with minimal dilatation of his ventricular anatomy with no dilatation of his third ventricle and no evidence of obstructive hydrocephalus. I would not favor external ventricular drainage in this patient. I do not think that this would improve his outcome nor do I think that hydrocephalus or is CSF flow is impeding his recovery. I would recommend to continue to work him up for other metabolic reasons to altered mental status in addition to his hemorrhagic CVA. With  continued rehabilitation physical,  occupational and speech therapy. I will continue to follow with you.  Samaiyah Howes P 12/05/2012, 10:01 AM

## 2012-12-05 NOTE — Progress Notes (Signed)
PULMONARY  / CRITICAL CARE MEDICINE  Name: Bryan Lam MRN: [id number] DOB: [birth year]    ADMISSION DATE:  11/30/2012  REFERRING MD :  EDP PRIMARY SERVICE: PCCM  CHIEF COMPLAINT:  ICH  BRIEF PATIENT DESCRIPTION: 79 y/o M who presented with weakness and change in mental status to ER on 7/18.  Found to have CT with large amt of intraventricular blood.     SIGNIFICANT EVENTS / STUDIES:  7/18 CT Head>>>large amt of intraventricular blood on L, trace on R 7/21 Weaned off nicardipine infusion 7/21 CT head: Slight decrease in size of left ventricular hemorrhage with less dilatation of the temporal horn of the left lateral  ventricle. No new intracranial hemorrhage or infarct identified. No midline shift. 7/22 More lethargic. SBP goal changed to 120 - 180 mmHg per Stroke Team 7/23 CT head: [facility]  7/23 NS consult: no indication for ventric drain @ present  LINES / TUBES:   CULTURES: none  ANTIBIOTICS: none  SUBJECTIVE/overnight:  Remains lethargic. No distress.  VITAL SIGNS: Temp:  [98 F (36.7 C)-101.2 F (38.4 C)] 98 F (36.7 C) (07/23 0414) Pulse Rate:  [36-86] 49 (07/23 1112) Resp:  [15-23] 21 (07/23 1112) BP: (127-200)/(59-97) 180/69 mmHg (07/23 1112) SpO2:  [93 %-97 %] 95 % (07/23 1112)  HEMODYNAMICS:    VENTILATOR SETTINGS:    INTAKE / OUTPUT: Intake/Output     07/22 0701 - 07/23 0700 07/23 0701 - 07/24 0700   P.O.  0   I.V. (mL/kg) 1580 (17.9) 300 (3.4)   Total Intake(mL/kg) 1580 (17.9) 300 (3.4)   Urine (mL/kg/hr) 1370 (0.6) 75 (0.2)   Total Output 1370 75   Net +210 +225          PHYSICAL EXAMINATION: General: NAD Neuro: RASS -1 tp -2, + F/C, MAEs. Pupils react but slightly asymmetric (R>L) HEENT: WNL Cardiovascular: IRIR, rate controlled, no M Lungs: Clear Abdomen:  Round/soft, bsx4 active Ext: No edema, warm  LABS:  Recent Labs Lab 11/30/12 1535 12/01/12 0501 12/02/12 0230 12/03/12 0540 12/04/12 1644 12/05/12 0355  HGB 12.3* 12.0* 11.1*  11.6* 12.2* 11.8*  WBC 8.2 8.2 8.6 8.9 10.6* 10.6*  PLT 150 144* 135* 161 151 146*  NA 135 133* 133* 132* 136 139  K 3.5 3.2* 3.6 3.6 3.7 3.7  CL 100 97 98 98 100 102  CO2 25 24 25 25 25 26   GLUCOSE 137* 133* 127* 128* 133* 125*  BUN 21 18 22  28* 44* 47*  CREATININE 0.93 0.80 0.83 0.83 0.89 0.96  CALCIUM 8.7 9.1 8.7 8.8 9.0 9.3  MG  --  1.9 2.0  --   --   --   PHOS  --  2.5 2.5  --   --   --   AST 13  --   --   --  11  --   ALT 9  --   --   --  9  --   ALKPHOS 65  --   --   --  56  --   BILITOT 0.8  --   --   --  1.1  --   PROT 7.0  --   --   --  6.8  --   ALBUMIN 3.4*  --   --   --  3.1*  --   INR 1.54* 1.23  --  1.22  --   --     Recent Labs Lab 12/01/12 0745 12/01/12 1701  GLUCAP 121* 145*   CXR: NNF  ASSESSMENT / PLAN:  NEUROLOGIC A:   Left Intraventricular Hemorrhage  Increased lethargy - no obvious metabolic or toxic cause P:   Mgmt per Stroke team Holding PO meds  Check TSH with AM labs 7/24  CARDIOVASCULAR A:  HTN Emergency, resolved Hx of AAA Repair Hx of CABG & Valve Replacement (porcine) P:  Cont BP goal 120 - 180 mmHg  RENAL A:  HypoMag. Hyponatremia, resolved P:   Correct electrolytes as indicated Cont IVFs until able to meet needs orally   GASTROINTESTINAL A:   No issues P:   NPO until cognition improves   HEMATOLOGIC A:   Coagulopathy due to warfarin, resolved P:  Follow CBC See Neuro notes re: resumption of ASA warfarin for AF and heart valve  INFECTIOUS A:   Isolated fever 7/22 No obvious site of infection P:   Cont to monitor off abx  OTHER A:   Gout P:   Monitor Holding allopurinol  Transfer to SDU 7/23. TRH to assume care as of AM 7/24 and PCCM to sign off   Billy Fischer, MD ; [facility] [phone].  After 5:30 PM or weekends, call [phone]

## 2012-12-05 NOTE — Progress Notes (Signed)
Stroke Team Progress Note  HISTORY 79 y.o. male with a past medical history significant for hypertension, CAD s/p CABG, s/p valve replacement, atrial fibrillation on coumadin, s/p AAA repair, transferred to [facility] from [facility] for further management of large intraventricular hemorrhage. Was very functional at home, and patient's wife called his son around 3 am today 11/30/2012 because he was feeling weak and was looking confused. Family report that then they noted that his left face was droopier than the right and the left arm was also weak. Family stated that he is getting more confused. Patient was taken to [facility] and had a CT brain that revealed a large Large amount of interventricular blood centered primarily within the atria of the left lateral ventricle at the level of the choroid plexus. There is minimal dilatation of the temporal horn of the left lateral ventricle and associated minimal (approximately 4 mm) of left to right midline shift. Trace amount of intraventricular blood is seen within the dependent portion of the right lateral ventricle. Family stated that he is getting more confused."  He was admitted to the neuro ICU for further evaluation and treatment.  SUBJECTIVE Family at bedside. Level of consciousness decreased.  OBJECTIVE Most recent Vital Signs: Filed Vitals:   12/05/12 0445 12/05/12 0500 12/05/12 0600 12/05/12 0700  BP: 200/82 173/97 147/70 140/84  Pulse: 36  62 83  Temp:      TempSrc:      Resp: 18  19 19   Height:      Weight:      SpO2: 96%  94% 94%   CBG (last 3)  No results found for this basename: GLUCAP,  in the last 72 hours  IV Fluid Intake:      MEDICATIONS  . antiseptic oral rinse  15 mL Mouth Rinse BID  . Chlorhexidine Gluconate Cloth  6 each Topical Q0600  . metoprolol succinate  25 mg Oral Daily  . mupirocin ointment  1 application Nasal BID  . ramipril  10 mg Oral Daily   PRN:  sodium chloride, acetaminophen, acetaminophen,  hydrALAZINE  Diet:  Cardiac thin liquids Activity:  Bedrest DVT Prophylaxis:  SCDs   CLINICALLY SIGNIFICANT STUDIES Basic Metabolic Panel:   Recent Labs Lab 12/01/12 0501 12/02/12 0230  12/04/12 1644 12/05/12 0355  NA 133* 133*  < > 136 139  K 3.2* 3.6  < > 3.7 3.7  CL 97 98  < > 100 102  CO2 24 25  < > 25 26  GLUCOSE 133* 127*  < > 133* 125*  BUN 18 22  < > 44* 47*  CREATININE 0.80 0.83  < > 0.89 0.96  CALCIUM 9.1 8.7  < > 9.0 9.3  MG 1.9 2.0  --   --   --   PHOS 2.5 2.5  --   --   --   < > = values in this interval not displayed. Liver Function Tests:   Recent Labs Lab 11/30/12 1535 12/04/12 1644  AST 13 11  ALT 9 9  ALKPHOS 65 56  BILITOT 0.8 1.1  PROT 7.0 6.8  ALBUMIN 3.4* 3.1*   CBC:  Recent Labs Lab 11/30/12 1535  12/04/12 1644 12/05/12 0355  WBC 8.2  < > 10.6* 10.6*  NEUTROABS 6.4  --   --   --   HGB 12.3*  < > 12.2* 11.8*  HCT 36.0*  < > 36.6* 36.2*  MCV 77.8*  < > 78.7 79.2  PLT 150  < >  151 146*  < > = values in this interval not displayed. Coagulation:   Recent Labs Lab 11/30/12 1535 12/01/12 0501 12/03/12 0540  LABPROT 18.1* 15.2 15.1  INR 1.54* 1.23 1.22   Cardiac Enzymes: No results found for this basename: CKTOTAL, CKMB, CKMBINDEX, TROPONINI,  in the last 168 hours Urinalysis:   Recent Labs Lab 11/30/12 1542 12/04/12 1845  COLORURINE AMBER* AMBER*  LABSPEC 1.016 1.022  PHURINE 7.0 5.5  GLUCOSEU NEGATIVE NEGATIVE  HGBUR MODERATE* SMALL*  BILIRUBINUR NEGATIVE SMALL*  KETONESUR 15* NEGATIVE  PROTEINUR 30* 30*  UROBILINOGEN 0.2 1.0  NITRITE NEGATIVE NEGATIVE  LEUKOCYTESUR NEGATIVE SMALL*   Lipid Panel No results found for this basename: chol,  trig,  hdl,  cholhdl,  vldl,  ldlcalc   HgbA1C  No results found for this basename: HGBA1C   Urine Drug Screen:   No results found for this basename: labopia,  cocainscrnur,  labbenz,  amphetmu,  thcu,  labbarb    Alcohol Level: No results found for this basename: ETH,  in the  last 168 hours   CT of the brain   12/04/2012 Stable appearance of large left and small right intraventricular hemorrhage. Stable low attenuation edema in the left  periventricular white matter. No significant change since previous study. 12/03/2012 Slight interval decrease in size of left ventricular hemorrhage with less dilatation of the temporal horn of the left lateral  ventricle. No new intracranial hemorrhage or infarct identified. No midline shift 12/01/2012    Stable.  Intraventricular hemorrhage centered at the left lateral ventricle, with some degree of trapping of that ventricle, and left hemisphere edema.  No new intracranial abnormality.     12/01/2012   Stable size and appearance of predominately left-sided intraventricular hemorrhage with stable to slightly increased prominence of the lateral ventricles as compared to the prior study.    11/30/2012     1.  Large amount of interventricular blood centered primarily within the atria of the left lateral ventricle at the level of the choroid plexus. The etiology of this hemorrhage is indeterminate but could be the sequela of trauma, hypercoagulable state and/or choroid plexus vascular malformation. 2.  The amount of left-sided intraventricular blood results in minimal dilatation of the temporal horn of the left lateral ventricle and associated minimal (approximately 4 mm) of left to right midline shift.  3.  Trace amount of intraventricular blood is seen within the dependent portion of the right lateral ventricle.    MRI of the brain    MRA of the brain    2D Echocardiogram    Carotid Doppler    CXR   12/03/2012    1.  Moderate cardiomegaly with pulmonary venous congestion, but no frank pulmonary edema. 2.  Low lung volumes with minimal bibasilar subsegmental atelectasis. 3.  Atherosclerosis.  12/01/2012    No evidence of acute infiltrate.    11/30/2012   1.  Cardiac enlargement. 2.  Suspect left pleural effusion. 3.  Infiltrate versus  atelectasis in the left base.    EKG     Therapy Recommendations   GENERAL EXAM:  Patient is in no distress  CARDIOVASCULAR:  Regular rate and rhythm, no murmurs, no carotid bruits  NEUROLOGIC:  MENTAL STATUS: ASLEEP BUT WAKES TO VOICE. SAYS SOME WORDS. SLOW RESPONSES. FOLLOWS COMMANDS INTERMITTENTLY.  CRANIAL NERVE: pupils (R 2.5, L 2, POST SURGICAL, MILD RXN), visual fields full to confrontation, extraocular muscles intact, no nystagmus, facial sensation and strength symmetric, uvula midline, shoulder shrug symmetric, tongue midline.  MOTOR: RUE 2, LUE 3, RLE 1, LLE 3.  SENSORY: normal and symmetric to light touch   ASSESSMENT [name] is a 79 y.o. male presenting with left sided  weakness and confusion. Imaging confirms a left lateral intraventricular hemorrhage. Hemorrhage felt to be secondary to combination of Hypertension and coumadin use.  On aspirin 81 mg orally every day and warfarin prior to admission. Now on no antithrombotics due to hemorrhage for secondary stroke prevention. Patient with resultant right field cut, right  hemiparesis. Work up completed.,  atrial fibrillation, on chronic coumadin S/p valve replacement, on chronic coumadin Hypertension S/p AAA repair CAD - s/p CABG No dementia at baseline  Hospital day # 5  TREATMENT/PLAN  Continue home BP medications, goal is SBP 180  Will need anti-coagulation at some point (afib and valve). Timing of coumadin, would start aspirin first, then wait 2-4 weeks to restart coumadin if hemorrhage stable, will be decided in the future.  Rehab has been consulted, not a candidate to move out of unit at this time.  Neurosurgery consulted to see patient to consider ventriculostomy as his level of conscious remains decreased.  Dr. Pearlean Brownie discussed plan of care with Dr. Sung Amabile and family.  Gwendolyn Lima. Manson Passey, [facility], MBA, MHA [facility] Pager: [phone] 12/05/2012 9:44 AM  I have personally obtained a  history, examined the patient, evaluated imaging results, and formulated the assessment and plan of care. I agree with the above.  Delia Heady, MD

## 2012-12-06 ENCOUNTER — Inpatient Hospital Stay (HOSPITAL_COMMUNITY): Payer: Medicare HMO

## 2012-12-06 DIAGNOSIS — I609 Nontraumatic subarachnoid hemorrhage, unspecified: Principal | ICD-10-CM | POA: Diagnosis present

## 2012-12-06 DIAGNOSIS — G9340 Encephalopathy, unspecified: Secondary | ICD-10-CM

## 2012-12-06 LAB — BASIC METABOLIC PANEL
CO2: 25 mEq/L (ref 19–32)
Calcium: 9.2 mg/dL (ref 8.4–10.5)
GFR calc non Af Amer: 64 mL/min — ABNORMAL LOW (ref >90)
Potassium: 3.2 mEq/L — ABNORMAL LOW (ref 3.5–5.1)
Sodium: 144 mEq/L (ref 135–145)

## 2012-12-06 LAB — TSH: TSH: 0.738 u[IU]/mL (ref 0.350–4.500)

## 2012-12-06 LAB — CBC
MCV: 79.7 fL (ref 78.0–100.0)
Platelets: 181 10*3/uL (ref 150–400)
RBC: 4.68 MIL/uL (ref 4.22–5.81)
WBC: 10.7 10*3/uL — ABNORMAL HIGH (ref 4.0–10.5)

## 2012-12-06 MED ORDER — POTASSIUM CHLORIDE CRYS ER 20 MEQ PO TBCR: 40.0000 meq | EXTENDED_RELEASE_TABLET | Freq: Once | ORAL | Status: DC

## 2012-12-06 MED ORDER — METOPROLOL SUCCINATE ER 25 MG PO TB24
25.0000 mg | ORAL_TABLET | Freq: Every day | ORAL | Status: DC
  Filled 2012-12-06: qty 1

## 2012-12-06 MED ORDER — DEXTROSE 5 % IV SOLN
500.0000 mg | INTRAVENOUS | Status: DC
  Administered 2012-12-06 – 2012-12-07 (×2): 500 mg via INTRAVENOUS
  Filled 2012-12-06 (×2): qty 500

## 2012-12-06 MED ORDER — METOPROLOL TARTRATE 1 MG/ML IV SOLN: 2.5000 mg | Freq: Four times a day (QID) | INTRAVENOUS | Status: DC | PRN

## 2012-12-06 MED ORDER — SODIUM CHLORIDE 0.9 % IV SOLN: 250.0000 mL | INTRAVENOUS | Status: DC | PRN

## 2012-12-06 NOTE — Progress Notes (Signed)
Stroke Team Progress Note  HISTORY 79 y.o. male with a past medical history significant for hypertension, CAD s/p CABG, s/p valve replacement, atrial fibrillation on coumadin, s/p AAA repair, transferred to [facility] from [facility] for further management of large intraventricular hemorrhage. Was very functional at home, and patient's wife called his son around 3 am today 11/30/2012 because he was feeling weak and was looking confused. Family report that then they noted that his left face was droopier than the right and the left arm was also weak. Family stated that he is getting more confused. Patient was taken to [facility] and had a CT brain that revealed a large Large amount of interventricular blood centered primarily within the atria of the left lateral ventricle at the level of the choroid plexus. There is minimal dilatation of the temporal horn of the left lateral ventricle and associated minimal (approximately 4 mm) of left to right midline shift. Trace amount of intraventricular blood is seen within the dependent portion of the right lateral ventricle. Family stated that he is getting more confused."  He was admitted to the neuro ICU for further evaluation and treatment.  SUBJECTIVE Family at bedside. Now stuporous.  OBJECTIVE Most recent Vital Signs: Filed Vitals:   12/06/12 0500 12/06/12 0520 12/06/12 0600 12/06/12 0700  BP: 185/99 181/147 125/59 128/69  Pulse: 44 80 114 88  Temp:      TempSrc:      Resp: 23 22 23 24   Height:      Weight:      SpO2: 97% 95% 96% 96%   CBG (last 3)  No results found for this basename: GLUCAP,  in the last 72 hours  IV Fluid Intake:      MEDICATIONS  . antiseptic oral rinse  15 mL Mouth Rinse BID  . mupirocin ointment  1 application Nasal BID   PRN:  sodium chloride, acetaminophen, acetaminophen, hydrALAZINE  Diet:  Cardiac thin liquids Activity:  Bedrest DVT Prophylaxis:  SCDs   CLINICALLY SIGNIFICANT STUDIES Basic Metabolic Panel:    Recent Labs Lab 12/01/12 0501 12/02/12 0230  12/05/12 0355 12/06/12 0530  NA 133* 133*  < > 139 144  K 3.2* 3.6  < > 3.7 3.2*  CL 97 98  < > 102 108  CO2 24 25  < > 26 25  GLUCOSE 133* 127*  < > 125* 111*  BUN 18 22  < > 47* 51*  CREATININE 0.80 0.83  < > 0.96 1.05  CALCIUM 9.1 8.7  < > 9.3 9.2  MG 1.9 2.0  --   --   --   PHOS 2.5 2.5  --   --   --   < > = values in this interval not displayed. Liver Function Tests:   Recent Labs Lab 11/30/12 1535 12/04/12 1644  AST 13 11  ALT 9 9  ALKPHOS 65 56  BILITOT 0.8 1.1  PROT 7.0 6.8  ALBUMIN 3.4* 3.1*   CBC:  Recent Labs Lab 11/30/12 1535  12/05/12 0355 12/06/12 0530  WBC 8.2  < > 10.6* 10.7*  NEUTROABS 6.4  --   --   --   HGB 12.3*  < > 11.8* 12.3*  HCT 36.0*  < > 36.2* 37.3*  MCV 77.8*  < > 79.2 79.7  PLT 150  < > 146* 181  < > = values in this interval not displayed. Coagulation:   Recent Labs Lab 11/30/12 1535 12/01/12 0501 12/03/12 0540  LABPROT 18.1* 15.2  15.1  INR 1.54* 1.23 1.22   Cardiac Enzymes: No results found for this basename: CKTOTAL, CKMB, CKMBINDEX, TROPONINI,  in the last 168 hours Urinalysis:   Recent Labs Lab 11/30/12 1542 12/04/12 1845  COLORURINE AMBER* AMBER*  LABSPEC 1.016 1.022  PHURINE 7.0 5.5  GLUCOSEU NEGATIVE NEGATIVE  HGBUR MODERATE* SMALL*  BILIRUBINUR NEGATIVE SMALL*  KETONESUR 15* NEGATIVE  PROTEINUR 30* 30*  UROBILINOGEN 0.2 1.0  NITRITE NEGATIVE NEGATIVE  LEUKOCYTESUR NEGATIVE SMALL*   Lipid Panel No results found for this basename: chol,  trig,  hdl,  cholhdl,  vldl,  ldlcalc   HgbA1C  No results found for this basename: HGBA1C   Urine Drug Screen:   No results found for this basename: labopia,  cocainscrnur,  labbenz,  amphetmu,  thcu,  labbarb    Alcohol Level: No results found for this basename: ETH,  in the last 168 hours   CT of the brain   12/04/2012 Stable appearance of large left and small right intraventricular hemorrhage. Stable low  attenuation edema in the left periventricular white matter. No significant change since previous study. 12/03/2012 Slight interval decrease in size of left ventricular hemorrhage with less dilatation of the temporal horn of the left lateral  ventricle. No new intracranial hemorrhage or infarct identified. No midline shift 12/01/2012    Stable.  Intraventricular hemorrhage centered at the left lateral ventricle, with some degree of trapping of that ventricle, and left hemisphere edema.  No new intracranial abnormality.     12/01/2012   Stable size and appearance of predominately left-sided intraventricular hemorrhage with stable to slightly increased prominence of the lateral ventricles as compared to the prior study.    11/30/2012     1.  Large amount of interventricular blood centered primarily within the atria of the left lateral ventricle at the level of the choroid plexus. The etiology of this hemorrhage is indeterminate but could be the sequela of trauma, hypercoagulable state and/or choroid plexus vascular malformation. 2.  The amount of left-sided intraventricular blood results in minimal dilatation of the temporal horn of the left lateral ventricle and associated minimal (approximately 4 mm) of left to right midline shift.  3.  Trace amount of intraventricular blood is seen within the dependent portion of the right lateral ventricle.    MRI of the brain    MRA of the brain    2D Echocardiogram    Carotid Doppler    CXR   12/03/2012    1.  Moderate cardiomegaly with pulmonary venous congestion, but no frank pulmonary edema. 2.  Low lung volumes with minimal bibasilar subsegmental atelectasis. 3.  Atherosclerosis.  12/01/2012    No evidence of acute infiltrate.    11/30/2012   1.  Cardiac enlargement. 2.  Suspect left pleural effusion. 3.  Infiltrate versus atelectasis in the left base.    EKG     Therapy Recommendations CIR   GENERAL EXAM:  Patient is in no distress  CARDIOVASCULAR:   Regular rate and rhythm, no murmurs, no carotid bruits  NEUROLOGIC:  MENTAL STATUS: ASLEEP BUT WAKES TO VOICE. SAYS SOME WORDS. SLOW RESPONSES. FOLLOWS COMMANDS INTERMITTENTLY.  CRANIAL NERVE: pupils (R 2.5, L 2, POST SURGICAL, MILD RXN), visual fields full to confrontation, extraocular muscles intact, no nystagmus, facial sensation and strength symmetric, uvula midline, shoulder shrug symmetric, tongue midline.  MOTOR: RUE 2, LUE 3, RLE 1, LLE 3.  SENSORY: normal and symmetric to light touch   ASSESSMENT Bryan Lam is a 79 y.o.  male presenting with left sided  weakness and confusion. Imaging confirms a left lateral intraventricular hemorrhage. Hemorrhage felt to be secondary to combination of Hypertension and coumadin use.  On aspirin 81 mg orally every day and warfarin prior to admission. Now on no antithrombotics due to hemorrhage for secondary stroke prevention. Patient with resultant right field cut, right  hemiparesis. Work up completed.  Protein malnutrition,  He was on oral diet but appears too stuporous to eat.  atrial fibrillation, on chronic coumadin prior to admission S/p valve replacement, on chronic coumadin prior to admission Hypertension S/p AAA repair CAD - s/p CABG No dementia at baseline Long term medication use  Hospital day # 6  TREATMENT/PLAN  Will ask speech therapy to see, await recommendations. I suspect he will not pass secondary to mental status and will need FT.  Continue home BP medications, goal is SBP 180  Will need anti-coagulation at some point (afib and valve). Timing of coumadin, would start aspirin first, then wait 2-4 weeks to restart coumadin if hemorrhage stable, will be decided in the future.  Neurosurgery, following. Saw 12/05/2012 and not felt to be a surgical candidate.  EEG to assess if underlying mental status changes could be due to seizure.  Dr. Pearlean Brownie discussed plan of care with Dr. Sung Amabile and family.  Gwendolyn Lima. Manson Passey, [facility], MBA,  MHA [facility] Pager: [phone] 12/06/2012 7:56 AM  I have personally obtained a history, examined the patient, evaluated imaging results, and formulated the assessment and plan of care. I agree with the above.  Delia Heady, MD

## 2012-12-06 NOTE — Progress Notes (Signed)
SLP Cancellation Note  Patient Details Name: Lamberto Dinapoli MRN: [id number] DOB: 03/23/1928   Cancelled treatment:        Unable to complete BSE at this time, as pt is having in-room procedure.  RN aware. Will continue efforts.  Maryjean Corpening B. Murvin Natal [facility], CCC-SLP [phone]   Leigh Aurora 12/06/2012, 3:25 PM

## 2012-12-06 NOTE — Progress Notes (Signed)
Rehab admissions - Family reports patient still very sleepy, not waking up much.  Not doing much with therapies.  Will continue to follow for progress.  Call me for questions.  #[phone]

## 2012-12-06 NOTE — Progress Notes (Signed)
Patient ID: Bryan Lam, male   DOB: 1928-02-06, 79 y.o.   MRN: [id number] Patient more alert today awakens to voice follows commands right hemiparesis. No neurosurgical intervention at this time

## 2012-12-06 NOTE — Progress Notes (Signed)
eeg completed at bedside

## 2012-12-06 NOTE — Procedures (Signed)
ELECTROENCEPHALOGRAM REPORT   Patient: Bryan Lam       Room #: 2604 EEG No. ID: [id number] Age: 79 y.o.        Sex: male Referring Physician: Rizwan Report Date:  12/06/2012        Interpreting Physician: Aline Brochure  History: [name] is an 79 y.o. male admitted with large left and smaller right acute lateral intraventricular hemorrhage with confusion.  Indications for study:  Rule out slowing of cerebral activity; rule out seizure activity.  Technique: This is an 18 channel routine scalp EEG performed at the bedside with bipolar and monopolar montages arranged in accordance to the international 10/20 system of electrode placement.   Description: EEG was performed in wakefulness and during light sleep. Predominant background activity during wakefulness consists of low amplitude 1-2 Hz diffuse continuous symmetrical delta activity with superimposed 8-9 Hz alpha rhythm report from posterior head regions. Photic stimulation produced no appreciable occipital driving response. Hyperventilation was not performed. During light sleep symmetrical sleep spindles and arousal responses were recorded. No epileptiform discharges recorded.  Interpretation: EEG is abnormal with mild generalized nonspecific slowing of cerebral activity, which can be seen with a wide variety of encephalopathic processes including degenerative as well as metabolic encephalopathy. No evidence of an epileptic disorder was recorded.   Venetia Maxon M.D. [facility] [phone]

## 2012-12-06 NOTE — Progress Notes (Signed)
Received patient from 3100 per hospital bed.  PAtient is asleep but arousable.  Respond to verbal communication and follow simple commands.  Family was concerned that patient had nothing to eat going 3 days now.  Dr. Butler Denmark was paged and informed her of patient's concerned. She ordered bedside swallow evaluation by nurses.  ST came to see patient but an EEG was in progress.  ST stated that she will come back.  Family is persistent on giving patient an Ensure as per patient's request.  I Went back to patient's bedside to do swallowing eval but he is not able to stay awake.  Son, Brett Canales at bedside.  I explained to the son that pt is not fully awake to participate with the eval.  Dr. Butler Denmark is made aware.

## 2012-12-06 NOTE — Progress Notes (Addendum)
[facility] Progress Note [place] TEAM 1 - Stepdown/ICU TEAM   Bryan Lam WUJ:[id number] DOB: 1928/04/24 DOA: 11/30/2012 PCP: No primary provider on file.  Brief narrative: This is an 79 year old male who lives at home and has a history of atrial fibrillation on Coumadin, aortic valve replacement with a porcine valve, CAD status post CABG, abdominal aortic aneurysm repair.   The patient presented to the hospital on 7/18 with weakness and confusion. He was initially taken to [facility] where a CT scan of the brain revealed interventricular blood. He was transferred to [facility] and admitted under the ICU service. A neurosurgical consult was requested and no surgical intervention was recommended. He was also noted to have malignant hypertension and it is suspected that the cranial bleed was secondary to uncontrolled blood pressure in the setting of Coumadin use.   Assessment/Plan: Principal Problem:   Subarachnoid hemorrhage/  CVA (cerebral infarction) -Per neurology, recommendations are to start aspirin first and if hemorrhage remains stable after waiting 2-4 weeks, then start Coumadin -Therapy recommends CIR consult -Patient was quite somnolent over the past couple of days but has just awakened and is hungry- will have RN repeat bedside swallow screen.  Active Problems:   Acute encephalopathy - alert now  Fevers  started on 7/22- peak temp 102- family noted cough and phlegm in mouth but CXR performed now was negative for pneumonia - will treat for acute bronchitis with zithromax 500 mg for 3 days - fevers may be neurogenic from bleed as well and this would be a poor prognostic sign- will need to treat aggressively with Tylenol    Malignant hypertension -BP controlled initially with nicardipine -Has been on IV hydralazine when necessary -Altace and Toprol have been on hold - now alert- resume Toprol to help control BP and heart rate    AF (atrial  fibrillation) with RVR - as above, need to resume Toprol today -Anticoagulation on hold  CAD status post CABG Aspirin on hold   Code Status: limited- intubation, use of antiarrhythmics and vasoactive drugs.  Family Communication: with wife and daughter Disposition Plan: cont to follow in SDU for rapid a-fib  Consultants: Neuro Neuro surgery  Procedures: none  Antibiotics: none  DVT prophylaxis: SCDs  HPI/Subjective: Patient asleep when examined. Family concerned that he has been so lethargic and has not eaten in past 2 days. Prior to that he was eating they state. About an hour later I received a call from the nurse that the patient is awake and alert and hungry.   Objective: Blood pressure 141/66, pulse 50, temperature 97.9 F (36.6 C), temperature source Axillary, resp. rate 24, height 6' (1.829 m), weight 90.9 kg (200 lb 6.4 oz), SpO2 95.00%.  Intake/Output Summary (Last 24 hours) at 12/06/12 1447 Last data filed at 12/06/12 1200  Gross per 24 hour  Intake   1650 ml  Output   1820 ml  Net   -170 ml     Exam: General: Sleeping, No acute respiratory distress HEENT: Yellow mucus noted in the patient's mouth Lungs: Clear to auscultation bilaterally without wheezes or crackles Cardiovascular: Regular rate and rhythm without murmur gallop or rub normal S1 and S2 Abdomen: Nontender, nondistended, soft, bowel sounds positive, no rebound, no ascites, no appreciable mass Extremities: No significant cyanosis, clubbing, or edema bilateral lower extremities Neuro: Unable to do neurological exam due to the patient's sedated state  Data Reviewed: Basic Metabolic Panel:  Recent Labs Lab 11/30/12 1535 12/01/12 0501 12/02/12 0230  12/03/12 0540 12/04/12 1644 12/05/12 0355 12/06/12 0530  NA 135 133* 133* 132* 136 139 144  K 3.5 3.2* 3.6 3.6 3.7 3.7 3.2*  CL 100 97 98 98 100 102 108  CO2 25 24 25 25 25 26 25   GLUCOSE 137* 133* 127* 128* 133* 125* 111*  BUN 21 18 22   28* 44* 47* 51*  CREATININE 0.93 0.80 0.83 0.83 0.89 0.96 1.05  CALCIUM 8.7 9.1 8.7 8.8 9.0 9.3 9.2  MG  --  1.9 2.0  --   --   --   --   PHOS  --  2.5 2.5  --   --   --   --    Liver Function Tests:  Recent Labs Lab 11/30/12 1535 12/04/12 1644  AST 13 11  ALT 9 9  ALKPHOS 65 56  BILITOT 0.8 1.1  PROT 7.0 6.8  ALBUMIN 3.4* 3.1*   No results found for this basename: LIPASE, AMYLASE,  in the last 168 hours No results found for this basename: AMMONIA,  in the last 168 hours CBC:  Recent Labs Lab 11/30/12 1535  12/02/12 0230 12/03/12 0540 12/04/12 1644 12/05/12 0355 12/06/12 0530  WBC 8.2  < > 8.6 8.9 10.6* 10.6* 10.7*  NEUTROABS 6.4  --   --   --   --   --   --   HGB 12.3*  < > 11.1* 11.6* 12.2* 11.8* 12.3*  HCT 36.0*  < > 34.2* 36.1* 36.6* 36.2* 37.3*  MCV 77.8*  < > 77.9* 79.0 78.7 79.2 79.7  PLT 150  < > 135* 161 151 146* 181  < > = values in this interval not displayed. Cardiac Enzymes: No results found for this basename: CKTOTAL, CKMB, CKMBINDEX, TROPONINI,  in the last 168 hours BNP (last 3 results) No results found for this basename: PROBNP,  in the last 8760 hours CBG:  Recent Labs Lab 12/01/12 0745 12/01/12 1701  GLUCAP 121* 145*    Recent Results (from the past 240 hour(s))  MRSA PCR SCREENING     Status: Abnormal   Collection Time    11/30/12  8:50 PM      Result Value Range Status   MRSA by PCR POSITIVE (*) NEGATIVE Final   Comment:            The GeneXpert MRSA Assay (FDA     approved for NASAL specimens     only), is one component of a     comprehensive MRSA colonization     surveillance program. It is not     intended to diagnose MRSA     infection nor to guide or     monitor treatment for     MRSA infections.     RESULT CALLED TO, READ BACK BY AND VERIFIED WITH:     SHELTON,M RN [id number] AT 2245 SKEEN,P  CULTURE, BLOOD (ROUTINE X 2)     Status: None   Collection Time    12/04/12  7:18 PM      Result Value Range Status   Specimen  Description BLOOD ARM RIGHT   Final   Special Requests BOTTLES DRAWN AEROBIC AND ANAEROBIC 10CC   Final   Culture  Setup Time 12/05/2012 04:12   Final   Culture     Final   Value:        BLOOD CULTURE RECEIVED NO GROWTH TO DATE CULTURE WILL BE HELD FOR 5 DAYS BEFORE ISSUING A FINAL NEGATIVE REPORT   Report  Status PENDING   Incomplete  CULTURE, BLOOD (ROUTINE X 2)     Status: None   Collection Time    12/04/12  7:25 PM      Result Value Range Status   Specimen Description BLOOD HAND RIGHT   Final   Special Requests BOTTLES DRAWN AEROBIC ONLY 10CC   Final   Culture  Setup Time 12/05/2012 04:12   Final   Culture     Final   Value:        BLOOD CULTURE RECEIVED NO GROWTH TO DATE CULTURE WILL BE HELD FOR 5 DAYS BEFORE ISSUING A FINAL NEGATIVE REPORT   Report Status PENDING   Incomplete     Studies:  Recent x-ray studies have been reviewed in detail by the Attending Physician  Scheduled Meds:  Scheduled Meds: . antiseptic oral rinse  15 mL Mouth Rinse BID   Continuous Infusions:   Time spent on care of this patient: 35 minutes   Abdulahi Schor,MD  [facility] Office  [phone] Pager - Text Page per Loretha Stapler as per below:  On-Call/Text Page:      [email or web address]      password [email or web address]  If 7PM-7AM, please contact night-coverage [email or web address] Password [facility] 12/06/2012, 2:47 PM   LOS: 6 days

## 2012-12-06 NOTE — Progress Notes (Signed)
PT NOW LETHARGIC AGAIN PER RN- HOLD PO MEDS AND FOOD

## 2012-12-06 NOTE — Progress Notes (Signed)
Physical Therapy Treatment Patient Details Name: Jaquarius Seder MRN: [id number] DOB: 1927/07/07 Today's Date: 12/06/2012 Time: 1191-4782 PT Time Calculation (min): 23 min  PT Assessment / Plan / Recommendation  History of Present Illness 79 y.o. male with a past medical history significant for hypertension, CAD s/p CABG, s/p valve replacement, atrial fibrillation on coumadin, s/p AAA repair, transferred to [facility] for further management of large left intraventricular hemorrhage. Pt less responsive than on eval.      PT Comments   Pt more lethargic now than on evaluation. To be reassessed by PT next visit for goal and discharge status check/update if needed.   Follow Up Recommendations  CIR           Equipment Recommendations  Other (comment) (TBA)    Recommendations for Other Services Rehab consult  Frequency Min 3X/week   Progress towards PT Goals Progress towards PT goals: Not progressing toward goals - comment;PT to reassess next treatment  Plan Other (comment) (PT to reassess next visit due to decr alertness from eval)    Precautions / Restrictions Precautions Precautions: Fall Restrictions Weight Bearing Restrictions: No    Pertinent Vitals/Pain BP before: 166/65 BP after: 143/58 HR: ranged 72-80 bpm SaO2: >/= to 96% on RA    Mobility  Bed Mobility Supine to Sit: 1: +2 Total assist Supine to Sit: Patient Percentage: 0% Sitting - Scoot to Edge of Bed: 1: +2 Total assist Sitting - Scoot to Edge of Bed: Patient Percentage: 10% Sit to Supine: 1: +2 Total assist Sit to Supine: Patient Percentage: 0% Scooting to HOB: 1: +2 Total assist Scooting to [facility]: Patient Percentage: 0% Details for Bed Mobility Assistance: Pt lethargic. Did open eyes and track lt x1 and rt x1 to family voice. Focused attention. Remained awake once lying down. 0% effort on pts part with bed mobility. Pt did maintain unsupported sitting balance with UE/LE  assist for 2-3 seconds.                      PT  Goals (current goals can now be found in the care plan section) Acute Rehab PT Goals Patient Stated Goal: to go home PT Goal Formulation: With patient Time For Goal Achievement: 12/17/12 Potential to Achieve Goals: Good  Visit Information  Last PT Received On: 12/06/12 Assistance Needed: +2 History of Present Illness: 79 y.o. male with a past medical history significant for hypertension, CAD s/p CABG, s/p valve replacement, atrial fibrillation on coumadin, s/p AAA repair, transferred to [facility] for further management of large left intraventricular hemorrhage. Pt less responsive than on eval.    Subjective Data  Patient Stated Goal: to go home   Cognition  Cognition Arousal/Alertness: Lethargic Behavior During Therapy: WFL for tasks assessed/performed Overall Cognitive Status: Difficult to assess Area of Impairment: Orientation;Attention;Memory;Following commands;Safety/judgement;Awareness;Problem solving Current Attention Level: Focused Safety/Judgement: Decreased awareness of safety;Decreased awareness of deficits Problem Solving: Difficulty sequencing;Decreased initiation;Slow processing;Requires verbal cues;Requires tactile cues Difficult to assess due to: Level of arousal    Balance  Static Sitting Balance Static Sitting - Balance Support: Bilateral upper extremity supported Static Sitting - Level of Assistance: 1: +2 Total assist Static Sitting - Comment/# of Minutes: EOB x6 minutes with pt's eyes open: pt tracked left and right x1 each to family voice. Pt able to maintain seated position without exteranal support for brief moment x1 trial. Able to prop onto left elbow to assist with lying down and rollking onto back.  End of Session PT - End of Session Equipment Utilized During Treatment: Oxygen Activity Tolerance: Patient limited by lethargy Patient left: in bed;with call bell/phone within reach;with family/visitor present Nurse Communication: Mobility status;Need for  lift equipment   GP     Sallyanne Kuster 12/06/2012, 1:14 PM  Sallyanne Kuster, PTA Office- [phone]

## 2012-12-07 ENCOUNTER — Inpatient Hospital Stay (HOSPITAL_COMMUNITY): Payer: Medicare HMO

## 2012-12-07 LAB — URINE CULTURE: Special Requests: NORMAL

## 2012-12-07 MED ORDER — JEVITY 1.2 CAL PO LIQD
1000.0000 mL | ORAL | Status: DC
  Filled 2012-12-07 (×5): qty 1000

## 2012-12-07 MED ORDER — JEVITY 1.2 CAL PO LIQD
1000.0000 mL | ORAL | Status: DC
  Filled 2012-12-07 (×2): qty 1000

## 2012-12-07 MED ORDER — METOPROLOL TARTRATE 1 MG/ML IV SOLN
5.0000 mg | Freq: Four times a day (QID) | INTRAVENOUS | Status: DC
  Administered 2012-12-07 – 2012-12-09 (×6): 5 mg via INTRAVENOUS
  Filled 2012-12-07 (×11): qty 5

## 2012-12-07 MED ORDER — KCL IN DEXTROSE-NACL 20-5-0.9 MEQ/L-%-% IV SOLN
INTRAVENOUS | Status: DC
  Administered 2012-12-07 – 2012-12-08 (×2): via INTRAVENOUS
  Administered 2012-12-09: 1000 mL via INTRAVENOUS
  Filled 2012-12-07 (×9): qty 1000

## 2012-12-07 MED ORDER — PIPERACILLIN-TAZOBACTAM 3.375 G IVPB
3.3750 g | Freq: Three times a day (TID) | INTRAVENOUS | Status: DC
  Administered 2012-12-07 – 2012-12-11 (×12): 3.375 g via INTRAVENOUS
  Filled 2012-12-07 (×15): qty 50

## 2012-12-07 MED ORDER — VANCOMYCIN HCL IN DEXTROSE 1-5 GM/200ML-% IV SOLN
1000.0000 mg | INTRAVENOUS | Status: AC
  Administered 2012-12-07: 1000 mg via INTRAVENOUS
  Filled 2012-12-07: qty 200

## 2012-12-07 MED ORDER — VANCOMYCIN HCL IN DEXTROSE 750-5 MG/150ML-% IV SOLN
750.0000 mg | Freq: Two times a day (BID) | INTRAVENOUS | Status: DC
  Administered 2012-12-08 – 2012-12-11 (×7): 750 mg via INTRAVENOUS
  Filled 2012-12-07 (×8): qty 150

## 2012-12-07 NOTE — Consult Note (Signed)
ANTIBIOTIC CONSULT NOTE - INITIAL  Pharmacy Consult for Vancomycin Indication: rule out pneumonia  No Known Allergies  Patient Measurements: Height: 6' (182.9 cm) Weight: 200 lb 6.4 oz (90.9 kg) IBW/kg (Calculated) : 77.6  Vital Signs: Temp: 97.6 F (36.4 C) (07/25 1550) Temp src: Oral (07/25 1550) BP: 159/78 mmHg (07/25 1550) Pulse Rate: 87 (07/25 1550) Intake/Output from previous day: 07/24 0701 - 07/25 0700 In: 1225 [I.V.:975; IV Piggyback:250] Out: 1000 [Urine:1000] Intake/Output from this shift: Total I/O In: 250 [IV Piggyback:250] Out: -   Labs:  Recent Labs  12/05/12 0355 12/06/12 0530  WBC 10.6* 10.7*  HGB 11.8* 12.3*  PLT 146* 181  CREATININE 0.96 1.05   Estimated Creatinine Clearance: 58.5 ml/min (by C-G formula based on Cr of 1.05).  Microbiology: Recent Results (from the past 720 hour(s))  MRSA PCR SCREENING     Status: Abnormal   Collection Time    11/30/12  8:50 PM      Result Value Range Status   MRSA by PCR POSITIVE (*) NEGATIVE Final   Comment:            The GeneXpert MRSA Assay (FDA     approved for NASAL specimens     only), is one component of a     comprehensive MRSA colonization     surveillance program. It is not     intended to diagnose MRSA     infection nor to guide or     monitor treatment for     MRSA infections.     RESULT CALLED TO, READ BACK BY AND VERIFIED WITH:     SHELTON,M RN [id number] AT 2245 SKEEN,P  CULTURE, BLOOD (ROUTINE X 2)     Status: None   Collection Time    12/04/12  7:18 PM      Result Value Range Status   Specimen Description BLOOD ARM RIGHT   Final   Special Requests BOTTLES DRAWN AEROBIC AND ANAEROBIC 10CC   Final   Culture  Setup Time 12/05/2012 04:12   Final   Culture     Final   Value:        BLOOD CULTURE RECEIVED NO GROWTH TO DATE CULTURE WILL BE HELD FOR 5 DAYS BEFORE ISSUING A FINAL NEGATIVE REPORT   Report Status PENDING   Incomplete  CULTURE, BLOOD (ROUTINE X 2)     Status: None   Collection  Time    12/04/12  7:25 PM      Result Value Range Status   Specimen Description BLOOD HAND RIGHT   Final   Special Requests BOTTLES DRAWN AEROBIC ONLY 10CC   Final   Culture  Setup Time 12/05/2012 04:12   Final   Culture     Final   Value:        BLOOD CULTURE RECEIVED NO GROWTH TO DATE CULTURE WILL BE HELD FOR 5 DAYS BEFORE ISSUING A FINAL NEGATIVE REPORT   Report Status PENDING   Incomplete  URINE CULTURE     Status: None   Collection Time    12/06/12  9:07 AM      Result Value Range Status   Specimen Description URINE, CATHETERIZED   Final   Special Requests none Normal   Final   Culture  Setup Time 12/06/2012 09:41   Final   Colony Count NO GROWTH   Final   Culture NO GROWTH   Final   Report Status 12/07/2012 FINAL   Final    Medical History: Past  Medical History  Diagnosis Date  . Hypertension   . AAA (abdominal aortic aneurysm)   . Gout   . Atrial fibrillation    Assessment: 85yom on coumadin pta for afib admitted with left ventricular hemorrhage. He has been spiking fevers on and off, starting 7/22 with Tmax 102 on 7/23. CXR has been negative for pneumonia, but given cough and increased secretions, suspect aspiration pneumonitis and antibiotics will be broadened from azithromycin to vancomycin and zosyn. Renal function is stable.  Goal of Therapy:  Vancomycin trough level 15-20 mcg/ml  Plan:  1) Vancomycin 1g x 1 now then 750mg  q12 2) Follow renal function, cultures, trough at steady state  Fredrik Rigger 12/07/2012,5:03 PM

## 2012-12-07 NOTE — Progress Notes (Signed)
Patient ID: Bryan Lam, male   DOB: 02-20-1928, 79 y.o.   MRN: [id number]  This is a late entry note I saw the patient on morning rounds he was slightly more arousable than he had been a previous 24 hours he would vocalize history difficult to get a follow commands but moved all extremities symmetrically pupils are equal and reactive. Based on earlier CAT scans I do not think that he is symptomatic from hydrocephalus I believe he has stable ventriculomegaly if not slightly improved and mental status seems to be slowly improving.

## 2012-12-07 NOTE — Progress Notes (Signed)
Notified NP of MD note stating to start Toprol on 7/24 but pt again too sleepy and was made NPO again and Toprol was discontinued.  No orders received.  Barnett Hatter P

## 2012-12-07 NOTE — Progress Notes (Signed)
SLP Cancellation Note  Patient Details Name: Bryan Lam MRN: [id number] DOB: 11-27-1927   Cancelled swallow eval:       Reason Eval/Treat Not Completed: Patient's level of consciousness. Pt not sufficiently alert to consume POs.  Will return later today.  Spoke with pt's son, wife.  May benefit from short-term enteral feeding until MS improves.   Blenda Mounts Laurice 12/07/2012, 9:36 AM

## 2012-12-07 NOTE — Progress Notes (Signed)
Rehab admissions - Noted transfer to 2604.  Remains lethargic and not able to stay awake much.  Family at bedside.  Not ready for inpatient rehab at this time.  I will follow up again on Monday.  #[phone]

## 2012-12-07 NOTE — Progress Notes (Signed)
[facility] Progress Note [place] TEAM 1 - Stepdown/ICU TEAM   Deborah Dondero ZOX:[id number] DOB: June 27, 1927 DOA: 11/30/2012 PCP: No primary provider on file.  Brief narrative: 79 year old male who lives at home and has a history of atrial fibrillation on coumadin, aortic valve replacement with a porcine valve, CAD status post CABG, abdominal aortic aneurysm repair.   The patient presented to the hospital on 7/18 with weakness and confusion. He was initially taken to [facility] where a CT scan of the brain revealed interventricular blood. He was transferred to [facility] and admitted under the ICU service. A Neurosurgical consult was requested and no surgical intervention was recommended. He was also noted to have malignant hypertension and it is suspected that the intracranial bleed was secondary to uncontrolled blood pressure in the setting of Coumadin use.  SIGNIFICANT EVENTS / STUDIES:  7/18 CT Head>>>large amt of intraventricular blood on L, trace on R  7/21 Weaned off nicardipine infusion  7/21 CT head: Slight decrease in size of left ventricular hemorrhage with less dilatation of the temporal horn of the left lateral ventricle. No new intracranial hemorrhage or infarct identified. No midline shift.  7/22 More lethargic. SBP goal changed to 120 - 180 mmHg per Stroke Team  7/23 CT head: [facility]  7/23 NS consult: no indication for ventric drain @ present   Assessment/Plan:  Left Intraventricular Hemorrhage  /  CVA (cerebral infarction) -Per Neurology, recommendations are to start aspirin first and if hemorrhage remains stable after waiting 2-4 weeks, then re-start Coumadin -Therapy recommends CIR  Acute encephalopathy Interfering with ability to eat - now has feeding tube - mental status remains quite depressed today, with pt having apparent difficulty managing secretions - EEG w/o evidence of Sz acitivity - if MS not improved by AM will recheck CT head to assure  no further bleeding (last one 7/23)  Fevers -started on 7/22 - peak temp 102 - family noted cough and phlegm in mouth but CXR negative for pneumonia - fevers may be neurogenic from bleed as well and this would be a poor prognostic sign - given trouble w/ secretions, aspiration pneumonitis considered likely - recheck CXR in AM - broaden coverage w/ zosyn and vanc (MRSA + on screen)  Malignant hypertension -BP controlled initially with nicardipine -Has been on IV hydralazine when necessary -Altace and Toprol have been on hold -care to avoid overcorrection, with primary goal to keep SBP <180  Aortic valve replacement with a porcine valve uanble to safely anticoagulate at this time - see discussion above   AF (atrial fibrillation) with RVR -rate controlled at this time  -Anticoagulation on hold  CAD status post CABG Aspirin on hold  Hx of AAA repair  Mild hypokalemia  Replete via IV and follow  Weakly + UA No growth on urine cx - will be more than adequately covered w/ abx receiving   MRSA screen +  Code Status: LIMITED - NO CPR - NO DEFIB - YES to intubation, pressors, or antiarrythmics  Family Communication: Spoke with wife, daughter, and son at bedside in detail Disposition Plan: SDU  Consultants: Neuro Neuro surgery PCCM >> TRH   Procedures: EEG - no evidence of Sz activity - 7/24  Antibiotics: Azithromycin 7/24 >> 7/25 Zosyn 7/25 >> Vancomycin 7/25 >>  DVT prophylaxis: SCDs  HPI/Subjective: The patient is very lethargic.  He will open his eyes to the examiner.  He does not follow commands.  He does not attempt to communicate.  He  is frequently coughing and gurgling/having obvious difficulty with his secretions.  Objective: Blood pressure 159/78, pulse 87, temperature 97.6 F (36.4 C), temperature source Oral, resp. rate 24, height 6' (1.829 m), weight 90.9 kg (200 lb 6.4 oz), SpO2 97.00%.  Intake/Output Summary (Last 24 hours) at 12/07/12 1618 Last data  filed at 12/07/12 0700  Gross per 24 hour  Intake    775 ml  Output    850 ml  Net    -75 ml   Exam: General: No acute respiratory distress - markedly lethargic Lungs: Coarse upper airway sounds transmitted throughout with no focal crackles or wheezes Cardiovascular: Regular rate and rhythm without murmur gallop or rub normal S1 and S2 Abdomen: Nontender, nondistended, soft, bowel sounds positive, no rebound, no ascites, no appreciable mass Extremities: No significant cyanosis, clubbing, or edema bilateral lower extremities Neuro: Essentially obtunded  Data Reviewed: Basic Metabolic Panel:  Recent Labs Lab 12/01/12 0501 12/02/12 0230 12/03/12 0540 12/04/12 1644 12/05/12 0355 12/06/12 0530  NA 133* 133* 132* 136 139 144  K 3.2* 3.6 3.6 3.7 3.7 3.2*  CL 97 98 98 100 102 108  CO2 24 25 25 25 26 25   GLUCOSE 133* 127* 128* 133* 125* 111*  BUN 18 22 28* 44* 47* 51*  CREATININE 0.80 0.83 0.83 0.89 0.96 1.05  CALCIUM 9.1 8.7 8.8 9.0 9.3 9.2  MG 1.9 2.0  --   --   --   --   PHOS 2.5 2.5  --   --   --   --    Liver Function Tests:  Recent Labs Lab 12/04/12 1644  AST 11  ALT 9  ALKPHOS 56  BILITOT 1.1  PROT 6.8  ALBUMIN 3.1*   CBC:  Recent Labs Lab 12/02/12 0230 12/03/12 0540 12/04/12 1644 12/05/12 0355 12/06/12 0530  WBC 8.6 8.9 10.6* 10.6* 10.7*  HGB 11.1* 11.6* 12.2* 11.8* 12.3*  HCT 34.2* 36.1* 36.6* 36.2* 37.3*  MCV 77.9* 79.0 78.7 79.2 79.7  PLT 135* 161 151 146* 181   CBG:  Recent Labs Lab 12/01/12 0745 12/01/12 1701  GLUCAP 121* 145*    Recent Results (from the past 240 hour(s))  MRSA PCR SCREENING     Status: Abnormal   Collection Time    11/30/12  8:50 PM      Result Value Range Status   MRSA by PCR POSITIVE (*) NEGATIVE Final   Comment:            The GeneXpert MRSA Assay (FDA     approved for NASAL specimens     only), is one component of a     comprehensive MRSA colonization     surveillance program. It is not     intended to  diagnose MRSA     infection nor to guide or     monitor treatment for     MRSA infections.     RESULT CALLED TO, READ BACK BY AND VERIFIED WITH:     SHELTON,M RN [id number] AT 2245 SKEEN,P  CULTURE, BLOOD (ROUTINE X 2)     Status: None   Collection Time    12/04/12  7:18 PM      Result Value Range Status   Specimen Description BLOOD ARM RIGHT   Final   Special Requests BOTTLES DRAWN AEROBIC AND ANAEROBIC 10CC   Final   Culture  Setup Time 12/05/2012 04:12   Final   Culture     Final   Value:  BLOOD CULTURE RECEIVED NO GROWTH TO DATE CULTURE WILL BE HELD FOR 5 DAYS BEFORE ISSUING A FINAL NEGATIVE REPORT   Report Status PENDING   Incomplete  CULTURE, BLOOD (ROUTINE X 2)     Status: None   Collection Time    12/04/12  7:25 PM      Result Value Range Status   Specimen Description BLOOD HAND RIGHT   Final   Special Requests BOTTLES DRAWN AEROBIC ONLY 10CC   Final   Culture  Setup Time 12/05/2012 04:12   Final   Culture     Final   Value:        BLOOD CULTURE RECEIVED NO GROWTH TO DATE CULTURE WILL BE HELD FOR 5 DAYS BEFORE ISSUING A FINAL NEGATIVE REPORT   Report Status PENDING   Incomplete  URINE CULTURE     Status: None   Collection Time    12/06/12  9:07 AM      Result Value Range Status   Specimen Description URINE, CATHETERIZED   Final   Special Requests none Normal   Final   Culture  Setup Time 12/06/2012 09:41   Final   Colony Count NO GROWTH   Final   Culture NO GROWTH   Final   Report Status 12/07/2012 FINAL   Final     Studies:  Recent x-ray studies have been reviewed in detail by the Attending Physician  Scheduled Meds:  Scheduled Meds: . antiseptic oral rinse  15 mL Mouth Rinse BID  . azithromycin  500 mg Intravenous Q24H  . potassium chloride  40 mEq Oral Once    Time spent on care of this patient: 35 minutes   [facility] T,MD  [facility] Office  [phone] Pager - Text Page per Loretha Stapler as per below:  On-Call/Text Page:       [email or web address]      password [email or web address]  If 7PM-7AM, please contact night-coverage [email or web address] Password [email or web address] 12/07/2012, 4:18 PM   LOS: 7 days

## 2012-12-07 NOTE — Progress Notes (Signed)
Pt NG tube placed for feedings, awaiting kub results. Adria Dill RN

## 2012-12-07 NOTE — Progress Notes (Signed)
Attempted to place NG tube again, still unsuccessful. Notified Dr Sharon Seller. Adria Dill RN

## 2012-12-07 NOTE — Progress Notes (Signed)
SLP Cancellation Note  Patient Details Name: Bryan Lam MRN: [id number] DOB: 1927-10-01   Cancelled evaluation:       Reason Eval/Treat Not Completed: Patient's level of consciousness Returned to determine readiness for swallow eval. Pt remains somnolent.  Plan is for NG per RN.  Discussed with son and spouse that SLP will follow daily for readiness.   Blenda Mounts Laurice 12/07/2012, 12:41 PM

## 2012-12-07 NOTE — Progress Notes (Signed)
Stroke Team Progress Note  HISTORY 79 y.o. male with a past medical history significant for hypertension, CAD s/p CABG, s/p valve replacement, atrial fibrillation on coumadin, s/p AAA repair, transferred to [facility] from [facility] for further management of large intraventricular hemorrhage. Was very functional at home, and patient's wife called his son around 3 am today 11/30/2012 because he was feeling weak and was looking confused. Family report that then they noted that his left face was droopier than the right and the left arm was also weak. Family stated that he is getting more confused. Patient was taken to [facility] and had a CT brain that revealed a large Large amount of interventricular blood centered primarily within the atria of the left lateral ventricle at the level of the choroid plexus. There is minimal dilatation of the temporal horn of the left lateral ventricle and associated minimal (approximately 4 mm) of left to right midline shift. Trace amount of intraventricular blood is seen within the dependent portion of the right lateral ventricle. Family stated that he is getting more confused."  He was admitted to the neuro ICU for further evaluation and treatment.  SUBJECTIVE Family at bedside. Now stuporous.  OBJECTIVE Most recent Vital Signs: Filed Vitals:   12/07/12 0407 12/07/12 0600 12/07/12 0747 12/07/12 0748  BP: 159/86 165/83  157/80  Pulse: 82 81 50 87  Temp: 100 F (37.8 C)  97.9 F (36.6 C)   TempSrc: Axillary  Oral   Resp: 28 28 26 25   Height:      Weight:      SpO2: 96% 97% 94% 95%   CBG (last 3)  No results found for this basename: GLUCAP,  in the last 72 hours  IV Fluid Intake:      MEDICATIONS  . antiseptic oral rinse  15 mL Mouth Rinse BID  . azithromycin  500 mg Intravenous Q24H  . potassium chloride  40 mEq Oral Once   PRN:  sodium chloride, acetaminophen, acetaminophen, metoprolol  Diet:  NPO  Activity:  Bedrest DVT Prophylaxis:  SCDs    CLINICALLY SIGNIFICANT STUDIES Basic Metabolic Panel:   Recent Labs Lab 12/01/12 0501 12/02/12 0230  12/05/12 0355 12/06/12 0530  NA 133* 133*  < > 139 144  K 3.2* 3.6  < > 3.7 3.2*  CL 97 98  < > 102 108  CO2 24 25  < > 26 25  GLUCOSE 133* 127*  < > 125* 111*  BUN 18 22  < > 47* 51*  CREATININE 0.80 0.83  < > 0.96 1.05  CALCIUM 9.1 8.7  < > 9.3 9.2  MG 1.9 2.0  --   --   --   PHOS 2.5 2.5  --   --   --   < > = values in this interval not displayed. Liver Function Tests:   Recent Labs Lab 11/30/12 1535 12/04/12 1644  AST 13 11  ALT 9 9  ALKPHOS 65 56  BILITOT 0.8 1.1  PROT 7.0 6.8  ALBUMIN 3.4* 3.1*   CBC:  Recent Labs Lab 11/30/12 1535  12/05/12 0355 12/06/12 0530  WBC 8.2  < > 10.6* 10.7*  NEUTROABS 6.4  --   --   --   HGB 12.3*  < > 11.8* 12.3*  HCT 36.0*  < > 36.2* 37.3*  MCV 77.8*  < > 79.2 79.7  PLT 150  < > 146* 181  < > = values in this interval not displayed. Coagulation:  Recent Labs Lab 11/30/12 1535 12/01/12 0501 12/03/12 0540  LABPROT 18.1* 15.2 15.1  INR 1.54* 1.23 1.22   Cardiac Enzymes: No results found for this basename: CKTOTAL, CKMB, CKMBINDEX, TROPONINI,  in the last 168 hours Urinalysis:   Recent Labs Lab 11/30/12 1542 12/04/12 1845  COLORURINE AMBER* AMBER*  LABSPEC 1.016 1.022  PHURINE 7.0 5.5  GLUCOSEU NEGATIVE NEGATIVE  HGBUR MODERATE* SMALL*  BILIRUBINUR NEGATIVE SMALL*  KETONESUR 15* NEGATIVE  PROTEINUR 30* 30*  UROBILINOGEN 0.2 1.0  NITRITE NEGATIVE NEGATIVE  LEUKOCYTESUR NEGATIVE SMALL*   Lipid Panel No results found for this basename: chol,  trig,  hdl,  cholhdl,  vldl,  ldlcalc   HgbA1C  No results found for this basename: HGBA1C   Urine Drug Screen:   No results found for this basename: labopia,  cocainscrnur,  labbenz,  amphetmu,  thcu,  labbarb    Alcohol Level: No results found for this basename: ETH,  in the last 168 hours   CT of the brain   12/04/2012 Stable appearance of large left and  small right intraventricular hemorrhage. Stable low attenuation edema in the left periventricular white matter. No significant change since previous study. 12/03/2012 Slight interval decrease in size of left ventricular hemorrhage with less dilatation of the temporal horn of the left lateral  ventricle. No new intracranial hemorrhage or infarct identified. No midline shift 12/01/2012    Stable.  Intraventricular hemorrhage centered at the left lateral ventricle, with some degree of trapping of that ventricle, and left hemisphere edema.  No new intracranial abnormality.     12/01/2012   Stable size and appearance of predominately left-sided intraventricular hemorrhage with stable to slightly increased prominence of the lateral ventricles as compared to the prior study.    11/30/2012     1.  Large amount of interventricular blood centered primarily within the atria of the left lateral ventricle at the level of the choroid plexus. The etiology of this hemorrhage is indeterminate but could be the sequela of trauma, hypercoagulable state and/or choroid plexus vascular malformation. 2.  The amount of left-sided intraventricular blood results in minimal dilatation of the temporal horn of the left lateral ventricle and associated minimal (approximately 4 mm) of left to right midline shift.  3.  Trace amount of intraventricular blood is seen within the dependent portion of the right lateral ventricle.    MRI of the brain    MRA of the brain    2D Echocardiogram    Carotid Doppler    CXR   12/03/2012    1.  Moderate cardiomegaly with pulmonary venous congestion, but no frank pulmonary edema. 2.  Low lung volumes with minimal bibasilar subsegmental atelectasis. 3.  Atherosclerosis.  12/01/2012    No evidence of acute infiltrate.    11/30/2012   1.  Cardiac enlargement. 2.  Suspect left pleural effusion. 3.  Infiltrate versus atelectasis in the left base.    EEG  abnormal with mild generalized nonspecific slowing  of cerebral activity, which can be seen with a wide variety of encephalopathic processes including degenerative as well as metabolic encephalopathy. No evidence of an epileptic disorder was recorded.  EKG     Therapy Recommendations CIR   GENERAL EXAM:  Patient is in no distress  CARDIOVASCULAR:  Regular rate and rhythm, no murmurs, no carotid bruits  NEUROLOGIC:  MENTAL STATUS: ASLEEP BUT WAKES TO VOICE. SAYS SOME WORDS. SLOW RESPONSES. FOLLOWS COMMANDS INTERMITTENTLY.  CRANIAL NERVE: pupils (R 2.5, L 2, POST SURGICAL, MILD  RXN), visual fields full to confrontation, extraocular muscles intact, no nystagmus, facial sensation and strength symmetric, uvula midline, shoulder shrug symmetric, tongue midline.  MOTOR: RUE 2, LUE 3, RLE 1, LLE 3.  SENSORY: normal and symmetric to light touch   ASSESSMENT [name] is a 79 y.o. male presenting with left sided  weakness and confusion. Imaging confirms a left lateral intraventricular hemorrhage. Hemorrhage felt to be secondary to combination of Hypertension and coumadin use.  On aspirin 81 mg orally every day and warfarin prior to admission. Now on no antithrombotics due to hemorrhage for secondary stroke prevention. Patient with resultant right field cut, right  hemiparesis. Work up completed.  Protein malnutrition,  He was on oral diet but appears too stuporous to eat. Speech therapy unable to perform swallow test secondary to another procedure; however to see patient today. Suspect will need to place feeding tube. atrial fibrillation, on chronic coumadin prior to admission S/p valve replacement, on chronic coumadin prior to admission Hypertension S/p AAA repair CAD - s/p CABG No dementia at baseline Long term medication use  Hospital day # 7  TREATMENT/PLAN  Speech therapy has noted lethargy of patient, will start feeding tube.  Continue home BP medications, goal is SBP 180  Will need anti-coagulation at some point (afib and  valve). Timing of coumadin, would start aspirin first, then wait 2-4 weeks to restart coumadin if hemorrhage stable, will be decided in the future.  Neurosurgery, following. Saw 12/05/2012 and not felt to be a surgical candidate.  EEG shows no elipetic disorder noted.  Goal: CIR  Dr. Pearlean Brownie discussed plan of care with family.  Gwendolyn Lima. Manson Passey, PAC, MBA, MHA [facility] Pager: [phone] 12/07/2012 8:00 AM  I have personally obtained a history, examined the patient, evaluated imaging results, and formulated the assessment and plan of care. I agree with the above.  Delia Heady, MD

## 2012-12-07 NOTE — Progress Notes (Signed)
INITIAL NUTRITION ASSESSMENT  DOCUMENTATION CODES Per approved criteria  -Not Applicable   INTERVENTION:  Once NGT placed & verified, initiate Jevity 1.2 formula at 15 ml/hr and increase by 10 ml every 4 hours to goal rate of 65 ml/hr to provide 1872 kcals, 87 gm protein, 1259 ml of free water RD to follow for nutrition care plan  NUTRITION DIAGNOSIS: Inadequate oral intake related to inability to eat, AMS as evidenced by NPO status  Goal: EN to meet > 90% of estimated nutrition needs  Monitor:  EN regimen & tolerance, PO diet advancement, weight, labs, I/O's  Reason for Assessment: Consult  79 y.o. male  Admitting Dx: Subarachnoid hemorrhage  ASSESSMENT: Patient with PMH of HTN, AAA s/p repair, gout, CABG with valve replacement on coumadin presented with weakness and change in mental status to ER; found to have CT with large amount of intraventricular blood.  RD unable to obtain nutrition hx from patient at this time; plan is to place NGT due to inability to initiate PO's due to somnolent state; Adult Tube Feeding Protocol utilizing Jevity 1.2 formula initiated ---> RD consulted to manage.  Height: Ht Readings from Last 1 Encounters:  12/06/12 6' (1.829 m)    Weight: Wt Readings from Last 1 Encounters:  12/06/12 200 lb 6.4 oz (90.9 kg)    Ideal Body Weight: 178 lb  % Ideal Body Weight: 112%  Wt Readings from Last 10 Encounters:  12/06/12 200 lb 6.4 oz (90.9 kg)    Usual Body Weight: unable to obtain  % Usual Body Weight: ---  BMI:  Body mass index is 27.17 kg/(m^2).  Estimated Nutritional Needs: Kcal: 1800-2000 Protein: 90-100 gm Fluid: 1.8-2.0 L  Skin: Intact  Diet Order: NPO  EDUCATION NEEDS: -No education needs identified at this time   Intake/Output Summary (Last 24 hours) at 12/07/12 1415 Last data filed at 12/07/12 0700  Gross per 24 hour  Intake    775 ml  Output    850 ml  Net    -75 ml    Labs:   Recent Labs Lab 11/30/12 1535  12/01/12 0501 12/02/12 0230  12/04/12 1644 12/05/12 0355 12/06/12 0530  NA 135 133* 133*  < > 136 139 144  K 3.5 3.2* 3.6  < > 3.7 3.7 3.2*  CL 100 97 98  < > 100 102 108  CO2 25 24 25   < > 25 26 25   BUN 21 18 22   < > 44* 47* 51*  CREATININE 0.93 0.80 0.83  < > 0.89 0.96 1.05  CALCIUM 8.7 9.1 8.7  < > 9.0 9.3 9.2  MG  --  1.9 2.0  --   --   --   --   PHOS  --  2.5 2.5  --   --   --   --   GLUCOSE 137* 133* 127*  < > 133* 125* 111*  < > = values in this interval not displayed.   Scheduled Meds: . antiseptic oral rinse  15 mL Mouth Rinse BID  . azithromycin  500 mg Intravenous Q24H  . potassium chloride  40 mEq Oral Once    Continuous Infusions: . feeding supplement (JEVITY 1.2 CAL)      Past Medical History  Diagnosis Date  . Hypertension   . AAA (abdominal aortic aneurysm)   . Gout   . Atrial fibrillation     Past Surgical History  Procedure Laterality Date  . Coronary artery bypass graft    .  Cardiac valve replacement    . Abdominal aortic aneurysm repair  02/14    Maureen Chatters, RD, LDN Pager #: [phone] After-Hours Pager #: [phone]

## 2012-12-07 NOTE — Progress Notes (Signed)
Attempted to advance NG tube, but tube will not advance, tube dc'ed and another nurse attempted but not successful. Dr Sharon Seller notified. Adria Dill RN

## 2012-12-08 ENCOUNTER — Inpatient Hospital Stay (HOSPITAL_COMMUNITY): Payer: Medicare HMO

## 2012-12-08 LAB — COMPREHENSIVE METABOLIC PANEL
ALT: 12 U/L (ref 0–53)
AST: 18 U/L (ref 0–37)
Alkaline Phosphatase: 42 U/L (ref 39–117)
CO2: 25 mEq/L (ref 19–32)
Calcium: 8.9 mg/dL (ref 8.4–10.5)
Chloride: 116 mEq/L — ABNORMAL HIGH (ref 96–112)
GFR calc Af Amer: 66 mL/min — ABNORMAL LOW (ref >90)
GFR calc non Af Amer: 57 mL/min — ABNORMAL LOW (ref >90)
Glucose, Bld: 144 mg/dL — ABNORMAL HIGH (ref 70–99)
Potassium: 3.3 mEq/L — ABNORMAL LOW (ref 3.5–5.1)
Sodium: 150 mEq/L — ABNORMAL HIGH (ref 135–145)
Total Bilirubin: 1.2 mg/dL (ref 0.3–1.2)

## 2012-12-08 LAB — GLUCOSE, CAPILLARY: Glucose-Capillary: 139 mg/dL — ABNORMAL HIGH (ref 70–99)

## 2012-12-08 LAB — MAGNESIUM: Magnesium: 2.3 mg/dL (ref 1.5–2.5)

## 2012-12-08 NOTE — Progress Notes (Signed)
SLP Cancellation Note  Patient Details Name: Bryan Lam MRN: [id number] DOB: August 02, 1927   Cancelled treatment:        Continued attempts to complete BSE.  Per RN, pt insufficiently arousable, and therefore inappropriate at this time. Will continue efforts.  Celia B. Murvin [facility], CCC-SLP [phone]  Leigh Aurora 12/08/2012, 9:14 AM

## 2012-12-08 NOTE — Progress Notes (Signed)
[facility] Progress Note [place] TEAM 1 - Stepdown/ICU TEAM   Bryan Lam YNW:[id number] DOB: 07-Oct-1927 DOA: 11/30/2012 PCP: No primary provider on file.  Brief narrative: 79 year old male who lives at home and has a history of atrial fibrillation on coumadin, aortic valve replacement with a porcine valve, CAD status post CABG, abdominal aortic aneurysm repair.   The patient presented to the hospital on 7/18 with weakness and confusion. He was initially taken to [facility] where a CT scan of the brain revealed interventricular blood. He was transferred to [facility] and admitted under the ICU service. A Neurosurgical consult was requested and no surgical intervention was recommended. He was also noted to have malignant hypertension and it is suspected that the intracranial bleed was secondary to uncontrolled blood pressure in the setting of Coumadin use.  SIGNIFICANT EVENTS / STUDIES:  7/18 CT Head>>>large amt of intraventricular blood on L, trace on R  7/21 Weaned off nicardipine infusion  7/21 CT head: Slight decrease in size of left ventricular hemorrhage with less dilatation of the temporal horn of the left lateral ventricle. No new intracranial hemorrhage or infarct identified. No midline shift.  7/22 More lethargic. SBP goal changed to 120 - 180 mmHg per Stroke Team  7/23 CT head: [facility]  7/23 NS consult: no indication for ventric drain @ present   Assessment/Plan:  Left Intraventricular Hemorrhage -Per Neurology, recommendations are to start aspirin first and if hemorrhage remains stable after waiting 2-4 weeks, then re-start Coumadin - not yet ready to start ASA at this time  -Therapy recommends eventual CIR  Acute encephalopathy Interfering with ability to eat - bedside feeding tube found to be malpositioned at GE jxn, and came out w/ attmpts to advance - mental status remains quite depressed - EEG w/o evidence of Sz acitivity - will need feeding tube  placed in radiology today   Fevers started on 7/22 - to 101.3 last PM - family noted cough and phlegm in mouth but CXR negative for pneumonia, and negative again on repeat today - fevers may be neurogenic - given trouble w/ secretions, aspiration pneumonitis considered likely - empiric coverage w/ zosyn and vanc (MRSA + on screen)  Malignant hypertension -BP controlled initially with nicardipine -Has been on IV hydralazine when necessary -Altace and Toprol have been on hold -care to avoid overcorrection, with primary goal to keep SBP <180 as defined by Neurology  Aortic valve replacement with a porcine valve uanble to safely anticoagulate at this time - see discussion above   AF (atrial fibrillation) with RVR -rate controlled at this time  -Anticoagulation on hold due to ICH  CAD status post CABG Aspirin on hold  Hx of AAA repair  Mild hypokalemia  Replete via IV and cont to follow  Weakly + UA No growth on urine cx - will be more than adequately covered w/ abx receiving   MRSA screen +  Code Status: LIMITED - NO CPR - NO DEFIB - YES to intubation, pressors, or antiarrythmics  Family Communication: Spoke with wife, daughter at bedside in detail Disposition Plan: SDU  Consultants: Neuro Neuro surgery PCCM >> TRH   Procedures: EEG - no evidence of Sz activity - 7/24  Antibiotics: Azithromycin 7/24 >> 7/25 Zosyn 7/25 >> Vancomycin 7/25 >>  DVT prophylaxis: SCDs  HPI/Subjective: The patient remains lethargic.  He will open his eyes to the examiner.  He does follow simple commands today.  He does not attempt to communicate.  He appears  to be handling his secretions better today with less coughing/gurgling.  Objective: Blood pressure 170/85, pulse 58, temperature 99.8 F (37.7 C), temperature source Axillary, resp. rate 27, height 6' (1.829 m), weight 90 kg (198 lb 6.6 oz), SpO2 94.00%.  Intake/Output Summary (Last 24 hours) at 12/08/12 1042 Last data filed at  12/08/12 0747  Gross per 24 hour  Intake    825 ml  Output   1200 ml  Net   -375 ml   Exam: General: No acute respiratory distress - markedly lethargic Lungs: Coarse upper airway sounds transmitted throughout with no focal crackles or wheezes Cardiovascular: Regular rate and rhythm withou gallop or rub - 2/6 holosystolic M appreciated  Abdomen: Nontender, nondistended, soft, bowel sounds positive, no rebound, no ascites, no appreciable mass Extremities: No significant cyanosis, clubbing, or edema bilateral lower extremities  Data Reviewed: Basic Metabolic Panel:  Recent Labs Lab 12/02/12 0230 12/03/12 0540 12/04/12 1644 12/05/12 0355 12/06/12 0530 12/08/12 0500  NA 133* 132* 136 139 144 150*  K 3.6 3.6 3.7 3.7 3.2* 3.3*  CL 98 98 100 102 108 116*  CO2 25 25 25 26 25 25   GLUCOSE 127* 128* 133* 125* 111* 144*  BUN 22 28* 44* 47* 51* 50*  CREATININE 0.83 0.83 0.89 0.96 1.05 1.15  CALCIUM 8.7 8.8 9.0 9.3 9.2 8.9  MG 2.0  --   --   --   --  2.3  PHOS 2.5  --   --   --   --   --    Liver Function Tests:  Recent Labs Lab 12/04/12 1644 12/08/12 0500  AST 11 18  ALT 9 12  ALKPHOS 56 42  BILITOT 1.1 1.2  PROT 6.8 6.2  ALBUMIN 3.1* 2.8*   CBC:  Recent Labs Lab 12/02/12 0230 12/03/12 0540 12/04/12 1644 12/05/12 0355 12/06/12 0530  WBC 8.6 8.9 10.6* 10.6* 10.7*  HGB 11.1* 11.6* 12.2* 11.8* 12.3*  HCT 34.2* 36.1* 36.6* 36.2* 37.3*  MCV 77.9* 79.0 78.7 79.2 79.7  PLT 135* 161 151 146* 181   CBG:  Recent Labs Lab 12/01/12 1701 12/08/12 0425 12/08/12 0751  GLUCAP 145* 121* 122*    Recent Results (from the past 240 hour(s))  MRSA PCR SCREENING     Status: Abnormal   Collection Time    11/30/12  8:50 PM      Result Value Range Status   MRSA by PCR POSITIVE (*) NEGATIVE Final   Comment:            The GeneXpert MRSA Assay (FDA     approved for NASAL specimens     only), is one component of a     comprehensive MRSA colonization     surveillance program.  It is not     intended to diagnose MRSA     infection nor to guide or     monitor treatment for     MRSA infections.     RESULT CALLED TO, READ BACK BY AND VERIFIED WITH:     SHELTON,M RN [id number] AT 2245 SKEEN,P  CULTURE, BLOOD (ROUTINE X 2)     Status: None   Collection Time    12/04/12  7:18 PM      Result Value Range Status   Specimen Description BLOOD ARM RIGHT   Final   Special Requests BOTTLES DRAWN AEROBIC AND ANAEROBIC 10CC   Final   Culture  Setup Time 12/05/2012 04:12   Final   Culture  Final   Value:        BLOOD CULTURE RECEIVED NO GROWTH TO DATE CULTURE WILL BE HELD FOR 5 DAYS BEFORE ISSUING A FINAL NEGATIVE REPORT   Report Status PENDING   Incomplete  CULTURE, BLOOD (ROUTINE X 2)     Status: None   Collection Time    12/04/12  7:25 PM      Result Value Range Status   Specimen Description BLOOD HAND RIGHT   Final   Special Requests BOTTLES DRAWN AEROBIC ONLY 10CC   Final   Culture  Setup Time 12/05/2012 04:12   Final   Culture     Final   Value:        BLOOD CULTURE RECEIVED NO GROWTH TO DATE CULTURE WILL BE HELD FOR 5 DAYS BEFORE ISSUING A FINAL NEGATIVE REPORT   Report Status PENDING   Incomplete  URINE CULTURE     Status: None   Collection Time    12/06/12  9:07 AM      Result Value Range Status   Specimen Description URINE, CATHETERIZED   Final   Special Requests none Normal   Final   Culture  Setup Time 12/06/2012 09:41   Final   Colony Count NO GROWTH   Final   Culture NO GROWTH   Final   Report Status 12/07/2012 FINAL   Final     Studies:  Recent x-ray studies have been reviewed in detail by the Attending Physician  Scheduled Meds:  Scheduled Meds: . antiseptic oral rinse  15 mL Mouth Rinse BID  . metoprolol  5 mg Intravenous Q6H  . piperacillin-tazobactam (ZOSYN)  IV  3.375 g Intravenous Q8H  . vancomycin  750 mg Intravenous Q12H    Time spent on care of this patient: 35 minutes   [facility] T,MD  [facility] Office   [phone] Pager - Text Page per Loretha Stapler as per below:  On-Call/Text Page:      [email or web address]      password [email or web address]  If 7PM-7AM, please contact night-coverage [email or web address] Password [email or web address] 12/08/2012, 10:42 AM   LOS: 8 days

## 2012-12-08 NOTE — Progress Notes (Signed)
Stroke Team Progress Note  HISTORY 79 y.o. male with a past medical history significant for hypertension, CAD s/p CABG, s/p valve replacement, atrial fibrillation on coumadin, s/p AAA repair, transferred to [facility] from [facility] for further management of large intraventricular hemorrhage. Was very functional at home, and patient's wife called his son around 3 am today 11/30/2012 because he was feeling weak and was looking confused. Family report that then they noted that his left face was droopier than the right and the left arm was also weak. Family stated that he is getting more confused. Patient was taken to [facility] and had a CT brain that revealed a large Large amount of interventricular blood centered primarily within the atria of the left lateral ventricle at the level of the choroid plexus. There is minimal dilatation of the temporal horn of the left lateral ventricle and associated minimal (approximately 4 mm) of left to right midline shift. Trace amount of intraventricular blood is seen within the dependent portion of the right lateral ventricle. Family stated that he is getting more confused."  He was admitted to the neuro ICU for further evaluation and treatment.  SUBJECTIVE Family at bedside. Remains sleepy but can be aroused and will follow some commands.  OBJECTIVE Most recent Vital Signs: Filed Vitals:   12/08/12 0507 12/08/12 0512 12/08/12 0745 12/08/12 0900  BP: 158/88  170/85   Pulse: 73   58  Temp: 100.5 F (38.1 C) 100.3 F (37.9 C) 99.8 F (37.7 C)   TempSrc: Axillary Axillary Axillary   Resp: 28   27  Height:      Weight:      SpO2: 96%   94%   CBG (last 3)   Recent Labs  12/08/12 0425 12/08/12 0751  GLUCAP 121* 122*    IV Fluid Intake:   . dextrose 5 % and 0.9 % NaCl with KCl 20 mEq/L 75 mL/hr at 12/07/12 1751  . feeding supplement (JEVITY 1.2 CAL)      MEDICATIONS  . antiseptic oral rinse  15 mL Mouth Rinse BID  . metoprolol  5 mg  Intravenous Q6H  . piperacillin-tazobactam (ZOSYN)  IV  3.375 g Intravenous Q8H  . vancomycin  750 mg Intravenous Q12H   PRN:  acetaminophen  Diet:  NPO  Activity:  Bedrest DVT Prophylaxis:  SCDs   CLINICALLY SIGNIFICANT STUDIES Basic Metabolic Panel:   Recent Labs Lab 12/02/12 0230  12/06/12 0530 12/08/12 0500  NA 133*  < > 144 150*  K 3.6  < > 3.2* 3.3*  CL 98  < > 108 116*  CO2 25  < > 25 25  GLUCOSE 127*  < > 111* 144*  BUN 22  < > 51* 50*  CREATININE 0.83  < > 1.05 1.15  CALCIUM 8.7  < > 9.2 8.9  MG 2.0  --   --  2.3  PHOS 2.5  --   --   --   < > = values in this interval not displayed. Liver Function Tests:   Recent Labs Lab 12/04/12 1644 12/08/12 0500  AST 11 18  ALT 9 12  ALKPHOS 56 42  BILITOT 1.1 1.2  PROT 6.8 6.2  ALBUMIN 3.1* 2.8*   CBC:   Recent Labs Lab 12/05/12 0355 12/06/12 0530  WBC 10.6* 10.7*  HGB 11.8* 12.3*  HCT 36.2* 37.3*  MCV 79.2 79.7  PLT 146* 181   Coagulation:   Recent Labs Lab 12/03/12 0540  LABPROT 15.1  INR 1.22  Cardiac Enzymes: No results found for this basename: CKTOTAL, CKMB, CKMBINDEX, TROPONINI,  in the last 168 hours Urinalysis:   Recent Labs Lab 12/04/12 1845  COLORURINE AMBER*  LABSPEC 1.022  PHURINE 5.5  GLUCOSEU NEGATIVE  HGBUR SMALL*  BILIRUBINUR SMALL*  KETONESUR NEGATIVE  PROTEINUR 30*  UROBILINOGEN 1.0  NITRITE NEGATIVE  LEUKOCYTESUR SMALL*   Lipid Panel No results found for this basename: chol,  trig,  hdl,  cholhdl,  vldl,  ldlcalc   HgbA1C  No results found for this basename: HGBA1C   Urine Drug Screen:   No results found for this basename: labopia,  cocainscrnur,  labbenz,  amphetmu,  thcu,  labbarb    Alcohol Level: No results found for this basename: ETH,  in the last 168 hours   CT of the brain   12/04/2012 Stable appearance of large left and small right intraventricular hemorrhage. Stable low attenuation edema in the left periventricular white matter. No significant  change since previous study. 12/03/2012 Slight interval decrease in size of left ventricular hemorrhage with less dilatation of the temporal horn of the left lateral  ventricle. No new intracranial hemorrhage or infarct identified. No midline shift 12/01/2012    Stable.  Intraventricular hemorrhage centered at the left lateral ventricle, with some degree of trapping of that ventricle, and left hemisphere edema.  No new intracranial abnormality.     12/01/2012   Stable size and appearance of predominately left-sided intraventricular hemorrhage with stable to slightly increased prominence of the lateral ventricles as compared to the prior study.    11/30/2012     1.  Large amount of interventricular blood centered primarily within the atria of the left lateral ventricle at the level of the choroid plexus. The etiology of this hemorrhage is indeterminate but could be the sequela of trauma, hypercoagulable state and/or choroid plexus vascular malformation. 2.  The amount of left-sided intraventricular blood results in minimal dilatation of the temporal horn of the left lateral ventricle and associated minimal (approximately 4 mm) of left to right midline shift.  3.  Trace amount of intraventricular blood is seen within the dependent portion of the right lateral ventricle.    MRI of the brain    MRA of the brain    2D Echocardiogram    Carotid Doppler    CXR   12/03/2012    1.  Moderate cardiomegaly with pulmonary venous congestion, but no frank pulmonary edema. 2.  Low lung volumes with minimal bibasilar subsegmental atelectasis. 3.  Atherosclerosis.  12/01/2012    No evidence of acute infiltrate.    11/30/2012   1.  Cardiac enlargement. 2.  Suspect left pleural effusion. 3.  Infiltrate versus atelectasis in the left base.    EEG  abnormal with mild generalized nonspecific slowing of cerebral activity, which can be seen with a wide variety of encephalopathic processes including degenerative as well as  metabolic encephalopathy. No evidence of an epileptic disorder was recorded.  EKG     Therapy Recommendations CIR   GENERAL EXAM:  Patient is in no distress  CARDIOVASCULAR:  Regular rate and rhythm, no murmurs, no carotid bruits  NEUROLOGIC:  MENTAL STATUS: ASLEEP BUT WAKES TO VOICE. SAYS SOME WORDS. SLOW RESPONSES. FOLLOWS COMMANDS INTERMITTENTLY.  CRANIAL NERVE: pupils (R 2.5, L 2, POST SURGICAL, MILD RXN), visual fields full to confrontation, extraocular muscles intact, no nystagmus, facial sensation and strength symmetric, uvula midline, shoulder shrug symmetric, tongue midline.  MOTOR: RUE 2, LUE 3, RLE 1, LLE 3.  SENSORY: normal and symmetric to light touch   ASSESSMENT Mr. Octave Montrose is a 79 y.o. male presenting with left sided  weakness and confusion. Imaging confirms a left lateral intraventricular hemorrhage. Hemorrhage felt to be secondary to combination of Hypertension and coumadin use.  On aspirin 81 mg orally every day and warfarin prior to admission. Now on no antithrombotics due to hemorrhage for secondary stroke prevention. Patient with resultant right field cut, right  hemiparesis. Work up completed.  Protein malnutrition,  He was on oral diet but appears too stuporous to eat. Speech therapy unable to perform swallow test secondary to another procedure; however to see patient today. Suspect will need to place feeding tube. atrial fibrillation, on chronic coumadin prior to admission S/p valve replacement, on chronic coumadin prior to admission Hypertension S/p AAA repair CAD - s/p CABG No dementia at baseline Long term medication use  Hospital day # 8  TREATMENT/PLAN  Speech therapy has noted lethargy of patient, will  place feeding tube under radiology guidance.  Continue home BP medications, goal is SBP 180  Will need anti-coagulation at some point (afib and valve). Timing of coumadin, would start aspirin first, then wait 2-4 weeks to restart coumadin if  hemorrhage stable, will be decided in the future.  Neurosurgery, following. Saw 12/05/2012 and not felt to be a surgical candidate.   Goal: CIR  I discussed plan of care with family.       Delia Heady, MD

## 2012-12-08 NOTE — Progress Notes (Signed)
Subjective: Patient reports Patient is lethargic and listless. Has not been eating. Tube feeding to be started however NG tube has not been able to be placed.  Objective: Vital signs in last 24 hours: Temp:  [97.6 F (36.4 C)-101.3 F (38.5 C)] 99.1 F (37.3 C) (07/26 1201) Pulse Rate:  [46-87] 46 (07/26 1000) Resp:  [24-37] 26 (07/26 1000) BP: (126-172)/(78-113) 156/90 mmHg (07/26 1000) SpO2:  [94 %-99 %] 94 % (07/26 1000) Weight:  [90 kg (198 lb 6.6 oz)] 90 kg (198 lb 6.6 oz) (07/26 0500)  Intake/Output from previous day: 07/25 0701 - 07/26 0700 In: 825 [I.V.:375; IV Piggyback:450] Out: 400 [Urine:400] Intake/Output this shift: Total I/O In: -  Out: 800 [Urine:800]  Arouses to painful stimulation. Opens eyes. Will localize pain will not follow commands.  Lab Results:  Recent Labs  12/06/12 0530  WBC 10.7*  HGB 12.3*  HCT 37.3*  PLT 181   BMET  Recent Labs  12/06/12 0530 12/08/12 0500  NA 144 150*  K 3.2* 3.3*  CL 108 116*  CO2 25 25  GLUCOSE 111* 144*  BUN 51* 50*  CREATININE 1.05 1.15  CALCIUM 9.2 8.9    Studies/Results: Dg Chest Port 1 View  12/08/2012   *RADIOLOGY REPORT*  Clinical Data: Follow up basilar atelectasis.  Possible aspiration.  PORTABLE CHEST - 1 VIEW 12/08/2012 0548 hours:  Comparison: Portable chest x-rays 12/06/2012 dating back to 11/30/2012.  Findings: Prior sternotomy CABG.  Stable marked cardiomegaly. Pulmonary vascularity normal without evidence of pulmonary edema. Improved aeration in the lung bases, with mild atelectasis persisting at the left base.  No new pulmonary parenchymal abnormalities.  IMPRESSION: Stable marked cardiomegaly without pulmonary edema.  Improved aeration in the lung bases with mild left basilar atelectasis persisting.  No new abnormalities.   Original Report Authenticated By: Hulan Saas, M.D.   Dg Chest Port 1 View  12/06/2012   *RADIOLOGY REPORT*  Clinical Data: Fevers and sputum  PORTABLE CHEST - 1 VIEW   Comparison: Chest radiograph 07/22 1014  Findings: Sternotomy wires overlie stable enlarged heart silhouette. Aorta is ectatic aorta.  Low lung volumes.  Mild central venous congestion is unchanged.  IMPRESSION: No interval change.  Bibasilar atelectasis and cardiomegaly.   Original Report Authenticated By: Genevive Bi, M.D.   Dg Abd Portable 1v  12/07/2012   *RADIOLOGY REPORT*  Clinical Data: Evaluate nasogastric tube placement.  PORTABLE ABDOMEN - 1 VIEW  Comparison: Chest films 07/24/ 2014  Findings: 2 supine views.  Aortic Endograft repair.  Nonobstructive bowel gas pattern.  Probable vascular calcification in the right hemi pelvis.  Nasogastric tube which terminates at the gastroesophageal junction. This may be looped on itself.  Patient rotated to the right on the second radiograph.  Small left pleural effusion.  Cardiomegaly.  IMPRESSION: Nasogastric terminating at the gastroesophageal junction, possibly looped on self.  Consider advancement   Original Report Authenticated By: Jeronimo Greaves, M.D.    Assessment/Plan: Intraventricular and subarachnoid hemorrhage. Notable lethargy.  LOS: 8 days   facilitate placement of an NG tube. CT head   Kit Mollett J 12/08/2012, 1:52 PM

## 2012-12-09 LAB — CBC
HCT: 36.5 % — ABNORMAL LOW (ref 39.0–52.0)
Hemoglobin: 11.6 g/dL — ABNORMAL LOW (ref 13.0–17.0)
MCH: 26.6 pg (ref 26.0–34.0)
MCV: 83.7 fL (ref 78.0–100.0)
Platelets: 130 10*3/uL — ABNORMAL LOW (ref 150–400)
RBC: 4.36 MIL/uL (ref 4.22–5.81)
WBC: 8.8 10*3/uL (ref 4.0–10.5)

## 2012-12-09 LAB — BASIC METABOLIC PANEL
CO2: 26 mEq/L (ref 19–32)
Calcium: 8.5 mg/dL (ref 8.4–10.5)
Chloride: 121 mEq/L — ABNORMAL HIGH (ref 96–112)
Glucose, Bld: 137 mg/dL — ABNORMAL HIGH (ref 70–99)
Sodium: 154 mEq/L — ABNORMAL HIGH (ref 135–145)

## 2012-12-09 LAB — GLUCOSE, CAPILLARY: Glucose-Capillary: 118 mg/dL — ABNORMAL HIGH (ref 70–99)

## 2012-12-09 MED ORDER — METOPROLOL TARTRATE 1 MG/ML IV SOLN
5.0000 mg | Freq: Four times a day (QID) | INTRAVENOUS | Status: DC
  Administered 2012-12-09 – 2012-12-11 (×8): 5 mg via INTRAVENOUS
  Filled 2012-12-09 (×9): qty 5

## 2012-12-09 MED ORDER — CHLORHEXIDINE GLUCONATE 0.12 % MT SOLN
15.0000 mL | Freq: Two times a day (BID) | OROMUCOSAL | Status: DC
  Administered 2012-12-09 – 2012-12-12 (×6): 15 mL via OROMUCOSAL
  Filled 2012-12-09 (×7): qty 15

## 2012-12-09 MED ORDER — BIOTENE DRY MOUTH MT LIQD
15.0000 mL | Freq: Two times a day (BID) | OROMUCOSAL | Status: DC
  Administered 2012-12-09 – 2012-12-12 (×6): 15 mL via OROMUCOSAL

## 2012-12-09 MED ORDER — DEXTROSE-NACL 5-0.45 % IV SOLN
INTRAVENOUS | Status: DC
  Administered 2012-12-09: 12:00:00 via INTRAVENOUS
  Administered 2012-12-10: 100 mL/h via INTRAVENOUS
  Administered 2012-12-11: 05:00:00 via INTRAVENOUS
  Administered 2012-12-11: 1000 mL via INTRAVENOUS
  Administered 2012-12-11 – 2012-12-12 (×3): via INTRAVENOUS

## 2012-12-09 NOTE — Evaluation (Signed)
SLP Note  Patient Details Name: Thien Berka MRN: [id number] DOB: 10/06/1927    SLP received order for BSE for 12/10/12 am.  Will complete as ordered.  Thanks.  Donavan Burnet, MS [facility] SLP [phone]

## 2012-12-09 NOTE — Progress Notes (Signed)
[facility] Progress Note Bryan Lam TEAM 1 - Stepdown/ICU TEAM   Bryan Lam VHQ:[id number] DOB: Oct 06, 1927 DOA: 11/30/2012 PCP: No primary provider on file.  Brief narrative: 79 year old male who lives at home and has a history of atrial fibrillation on coumadin, aortic valve replacement with a porcine valve, CAD status post CABG, abdominal aortic aneurysm repair.   The patient presented to the hospital on 7/18 with weakness and confusion. He was initially taken to [facility] where a CT scan of the brain revealed interventricular blood. He was transferred to [facility] and admitted under the ICU service. A Neurosurgical consult was requested and no surgical intervention was recommended. He was also noted to have malignant hypertension and it is suspected that the intracranial bleed was secondary to uncontrolled blood pressure in the setting of Coumadin use.  SIGNIFICANT EVENTS / STUDIES:  7/18 CT Head>>>large amt of intraventricular blood on L, trace on R  7/21 Weaned off nicardipine infusion  7/21 CT head: Slight decrease in size of left ventricular hemorrhage with less dilatation of the temporal horn of the left lateral ventricle. No new intracranial hemorrhage or infarct identified. No midline shift.  7/22 More lethargic. SBP goal changed to 120 - 180 mmHg per Stroke Team  7/23 CT head: [facility]  7/23 NS consult: no indication for ventric drain @ present   Assessment/Plan:  Left Intraventricular Intracranial Hemorrhage -Per Neurology, recommendations are to start aspirin first and if hemorrhage remains stable after waiting 2-4 weeks, then re-start Coumadin - not yet ready to start ASA at this time  -Therapy recommends eventual CIR -pt has improved significantly in regard to mental status over the last 24hrs   Acute encephalopathy Interfering with ability to eat - bedside feeding tube found to be malpositioned at GE jxn, and came out w/ attempts to advance -  attmepts to place feeing tube in Radiolgoy failed as well - will have to have PEG tube if unable to pass swallow eval 7/28 -  EEG w/o evidence of Sz acitivity - mental status markedly improved today  Fevers started on 7/22 - to 101.3 again last PM - family noted cough and phlegm in mouth but CXR negative for pneumonia, and negative again on repeat 7/26 - fevers may be neurogenic - given trouble w/ secretions, aspiration pneumonitis considered likely - empiric coverage w/ zosyn and vanc (MRSA + on screen) to be continued for 5 days of tx   Malignant hypertension -BP controlled initially with nicardipine -Has been on IV hydralazine when necessary -Altace and Toprol have been on hold -care to avoid overcorrection, with primary goal to keep SBP <180 as defined by Neurology  Aortic valve replacement with a porcine valve uanble to safely anticoagulate at this time - see discussion above   AF (atrial fibrillation) with RVR -rate controlled at this time  -Anticoagulation on hold due to ICH  CAD status post CABG  Aspirin on hold  Hx of AAA repair  Mild hypokalemia  Replete via IV and cont to follow  Weakly + UA No growth on urine cx - will be more than adequately covered w/ abx receiving   MRSA screen +  Code Status: LIMITED - NO CPR - NO DEFIB - YES to intubation, pressors, or antiarrythmics  Family Communication: Spoke with wife & daughter at bedside in detail Disposition Plan: SDU  Consultants: Neuro Neuro surgery PCCM >> TRH   Procedures: EEG - no evidence of Sz activity - 7/24  Antibiotics: Azithromycin 7/24 >>  7/25 Zosyn 7/25 >> Vancomycin 7/25 >>  DVT prophylaxis: SCDs  HPI/Subjective: The pt is much more alert today.  He will answer limited questions, and is clearly able to recognize his wife.  His speech is dysarthric.    Objective: Blood pressure 146/77, pulse 35, temperature 97.8 F (36.6 C), temperature source Oral, resp. rate 29, height 6' (1.829 m), weight  88 kg (194 lb 0.1 oz), SpO2 98.00%.  Intake/Output Summary (Last 24 hours) at 12/09/12 1030 Last data filed at 12/09/12 0800  Gross per 24 hour  Intake 2997.5 ml  Output    950 ml  Net 2047.5 ml   Exam: General: No acute respiratory distress - much more alert Lungs: Coarse upper airway sounds transmitted throughout with no focal crackles or wheezes Cardiovascular: Regular rate and rhythm withou gallop or rub - 2/6 holosystolic M appreciated  Abdomen: Nontender, nondistended, soft, bowel sounds positive, no rebound, no ascites, no appreciable mass Extremities: No significant cyanosis, clubbing, or edema bilateral lower extremities  Data Reviewed: Basic Metabolic Panel:  Recent Labs Lab 12/04/12 1644 12/05/12 0355 12/06/12 0530 12/08/12 0500 12/09/12 0440  NA 136 139 144 150* 154*  K 3.7 3.7 3.2* 3.3* 3.4*  CL 100 102 108 116* 121*  CO2 25 26 25 25 26   GLUCOSE 133* 125* 111* 144* 137*  BUN 44* 47* 51* 50* 47*  CREATININE 0.89 0.96 1.05 1.15 1.22  CALCIUM 9.0 9.3 9.2 8.9 8.5  MG  --   --   --  2.3  --    Liver Function Tests:  Recent Labs Lab 12/04/12 1644 12/08/12 0500  AST 11 18  ALT 9 12  ALKPHOS 56 42  BILITOT 1.1 1.2  PROT 6.8 6.2  ALBUMIN 3.1* 2.8*   CBC:  Recent Labs Lab 12/03/12 0540 12/04/12 1644 12/05/12 0355 12/06/12 0530 12/09/12 0440  WBC 8.9 10.6* 10.6* 10.7* 8.8  HGB 11.6* 12.2* 11.8* 12.3* 11.6*  HCT 36.1* 36.6* 36.2* 37.3* 36.5*  MCV 79.0 78.7 79.2 79.7 83.7  PLT 161 151 146* 181 130*   CBG:  Recent Labs Lab 12/08/12 0425 12/08/12 0751 12/08/12 1204 12/08/12 1754 12/09/12 0817  GLUCAP 121* 122* 139* 112* 131*    Recent Results (from the past 240 hour(s))  MRSA PCR SCREENING     Status: Abnormal   Collection Time    11/30/12  8:50 PM      Result Value Range Status   MRSA by PCR POSITIVE (*) NEGATIVE Final   Comment:            The GeneXpert MRSA Assay (FDA     approved for NASAL specimens     only), is one component of  a     comprehensive MRSA colonization     surveillance program. It is not     intended to diagnose MRSA     infection nor to guide or     monitor treatment for     MRSA infections.     RESULT CALLED TO, READ BACK BY AND VERIFIED WITH:     SHELTON,M RN [id number] AT 2245 SKEEN,P  CULTURE, BLOOD (ROUTINE X 2)     Status: None   Collection Time    12/04/12  7:18 PM      Result Value Range Status   Specimen Description BLOOD ARM RIGHT   Final   Special Requests BOTTLES DRAWN AEROBIC AND ANAEROBIC 10CC   Final   Culture  Setup Time 12/05/2012 04:12   Final  Culture     Final   Value:        BLOOD CULTURE RECEIVED NO GROWTH TO DATE CULTURE WILL BE HELD FOR 5 DAYS BEFORE ISSUING A FINAL NEGATIVE REPORT   Report Status PENDING   Incomplete  CULTURE, BLOOD (ROUTINE X 2)     Status: None   Collection Time    12/04/12  7:25 PM      Result Value Range Status   Specimen Description BLOOD HAND RIGHT   Final   Special Requests BOTTLES DRAWN AEROBIC ONLY 10CC   Final   Culture  Setup Time 12/05/2012 04:12   Final   Culture     Final   Value:        BLOOD CULTURE RECEIVED NO GROWTH TO DATE CULTURE WILL BE HELD FOR 5 DAYS BEFORE ISSUING A FINAL NEGATIVE REPORT   Report Status PENDING   Incomplete  URINE CULTURE     Status: None   Collection Time    12/06/12  9:07 AM      Result Value Range Status   Specimen Description URINE, CATHETERIZED   Final   Special Requests none Normal   Final   Culture  Setup Time 12/06/2012 09:41   Final   Colony Count NO GROWTH   Final   Culture NO GROWTH   Final   Report Status 12/07/2012 FINAL   Final     Studies:  Recent x-ray studies have been reviewed in detail by the Attending Physician  Scheduled Meds:  Scheduled Meds: . antiseptic oral rinse  15 mL Mouth Rinse q12n4p  . chlorhexidine  15 mL Mouth Rinse BID  . metoprolol  5 mg Intravenous Q6H  . piperacillin-tazobactam (ZOSYN)  IV  3.375 g Intravenous Q8H  . vancomycin  750 mg Intravenous Q12H     Time spent on care of this patient: 25 minutes   [facility] T,MD  [facility] Office  [phone] Pager - Text Page per Loretha Stapler as per below:  On-Call/Text Page:      [email or web address]      password [email or web address]  If 7PM-7AM, please contact night-coverage [email or web address] Password [email or web address] 12/09/2012, 10:30 AM   LOS: 9 days

## 2012-12-09 NOTE — Progress Notes (Signed)
Stroke Team Progress Note  HISTORY 79 y.o. male with a past medical history significant for hypertension, CAD s/p CABG, s/p valve replacement, atrial fibrillation on coumadin, s/p AAA repair, transferred to [facility] from [facility] for further management of large intraventricular hemorrhage. Was very functional at home, and patient's wife called his son around 3 am today 11/30/2012 because he was feeling weak and was looking confused. Family report that then they noted that his left face was droopier than the right and the left arm was also weak. Family stated that he is getting more confused. Patient was taken to [facility] and had a CT brain that revealed a large Large amount of interventricular blood centered primarily within the atria of the left lateral ventricle at the level of the choroid plexus. There is minimal dilatation of the temporal horn of the left lateral ventricle and associated minimal (approximately 4 mm) of left to right midline shift. Trace amount of intraventricular blood is seen within the dependent portion of the right lateral ventricle. Family stated that he is getting more confused."  He was admitted to the neuro ICU for further evaluation and treatment.  SUBJECTIVE Family at bedside. Remains sleepy but can be aroused and will follow some commands.  OBJECTIVE Most recent Vital Signs: Filed Vitals:   12/09/12 0400 12/09/12 0403 12/09/12 0600 12/09/12 0800  BP: 145/72 145/72 140/78 151/80  Pulse: 49 67 63 37  Temp:  99.3 F (37.4 C)  97.8 F (36.6 C)  TempSrc:  Oral  Oral  Resp: 27 27 25 22   Height:      Weight:  194 lb 0.1 oz (88 kg)    SpO2: 98% 99% 98% 98%   CBG (last 3)   Recent Labs  12/08/12 1204 12/08/12 1754 12/09/12 0817  GLUCAP 139* 112* 131*    IV Fluid Intake:   . dextrose 5 % and 0.9 % NaCl with KCl 20 mEq/L 1,000 mL (12/09/12 0020)  . feeding supplement (JEVITY 1.2 CAL)      MEDICATIONS  . antiseptic oral rinse  15 mL Mouth Rinse  [id number]  . chlorhexidine  15 mL Mouth Rinse BID  . metoprolol  5 mg Intravenous Q6H  . piperacillin-tazobactam (ZOSYN)  IV  3.375 g Intravenous Q8H  . vancomycin  750 mg Intravenous Q12H   PRN:  acetaminophen  Diet:  NPO  Activity:  Bedrest DVT Prophylaxis:  SCDs   CLINICALLY SIGNIFICANT STUDIES Basic Metabolic Panel:   Recent Labs Lab 12/08/12 0500 12/09/12 0440  NA 150* 154*  K 3.3* 3.4*  CL 116* 121*  CO2 25 26  GLUCOSE 144* 137*  BUN 50* 47*  CREATININE 1.15 1.22  CALCIUM 8.9 8.5  MG 2.3  --    Liver Function Tests:   Recent Labs Lab 12/04/12 1644 12/08/12 0500  AST 11 18  ALT 9 12  ALKPHOS 56 42  BILITOT 1.1 1.2  PROT 6.8 6.2  ALBUMIN 3.1* 2.8*   CBC:   Recent Labs Lab 12/06/12 0530 12/09/12 0440  WBC 10.7* 8.8  HGB 12.3* 11.6*  HCT 37.3* 36.5*  MCV 79.7 83.7  PLT 181 130*   Coagulation:   Recent Labs Lab 12/03/12 0540  LABPROT 15.1  INR 1.22   Cardiac Enzymes: No results found for this basename: CKTOTAL, CKMB, CKMBINDEX, TROPONINI,  in the last 168 hours Urinalysis:   Recent Labs Lab 12/04/12 1845  COLORURINE AMBER*  LABSPEC 1.022  PHURINE 5.5  GLUCOSEU NEGATIVE  HGBUR SMALL*  BILIRUBINUR SMALL*  KETONESUR NEGATIVE  PROTEINUR 30*  UROBILINOGEN 1.0  NITRITE NEGATIVE  LEUKOCYTESUR SMALL*   Lipid Panel No results found for this basename: chol,  trig,  hdl,  cholhdl,  vldl,  ldlcalc   HgbA1C  No results found for this basename: HGBA1C   Urine Drug Screen:   No results found for this basename: labopia,  cocainscrnur,  labbenz,  amphetmu,  thcu,  labbarb    Alcohol Level: No results found for this basename: ETH,  in the last 168 hours   CT of the brain   12/04/2012 Stable appearance of large left and small right intraventricular hemorrhage. Stable low attenuation edema in the left periventricular white matter. No significant change since previous study. 12/03/2012 Slight interval decrease in size of left ventricular  hemorrhage with less dilatation of the temporal horn of the left lateral  ventricle. No new intracranial hemorrhage or infarct identified. No midline shift 12/01/2012    Stable.  Intraventricular hemorrhage centered at the left lateral ventricle, with some degree of trapping of that ventricle, and left hemisphere edema.  No new intracranial abnormality.     12/01/2012   Stable size and appearance of predominately left-sided intraventricular hemorrhage with stable to slightly increased prominence of the lateral ventricles as compared to the prior study.    11/30/2012     1.  Large amount of interventricular blood centered primarily within the atria of the left lateral ventricle at the level of the choroid plexus. The etiology of this hemorrhage is indeterminate but could be the sequela of trauma, hypercoagulable state and/or choroid plexus vascular malformation. 2.  The amount of left-sided intraventricular blood results in minimal dilatation of the temporal horn of the left lateral ventricle and associated minimal (approximately 4 mm) of left to right midline shift.  3.  Trace amount of intraventricular blood is seen within the dependent portion of the right lateral ventricle.       CXR   12/03/2012    1.  Moderate cardiomegaly with pulmonary venous congestion, but no frank pulmonary edema. 2.  Low lung volumes with minimal bibasilar subsegmental atelectasis. 3.  Atherosclerosis.  12/01/2012    No evidence of acute infiltrate.    11/30/2012   1.  Cardiac enlargement. 2.  Suspect left pleural effusion. 3.  Infiltrate versus atelectasis in the left base.    EEG  abnormal with mild generalized nonspecific slowing of cerebral activity, which can be seen with a wide variety of encephalopathic processes including degenerative as well as metabolic encephalopathy. No evidence of an epileptic disorder was recorded.  EKG     Therapy Recommendations CIR   GENERAL EXAM:  Patient is in no distress  CARDIOVASCULAR:   Regular rate and rhythm, no murmurs, no carotid bruits  NEUROLOGIC:  MENTAL STATUS: Awake. SAYS SOME WORDS. SLOW RESPONSES. FOLLOWS COMMANDS INTERMITTENTLY.  CRANIAL NERVE: pupils (R 2.5, L 2, POST SURGICAL, MILD RXN), visual fields full to confrontation, extraocular muscles intact, no nystagmus, facial sensation and strength symmetric, uvula midline, shoulder shrug symmetric, tongue midline.  MOTOR: RUE 2, LUE 3, RLE 1, LLE 3.  SENSORY: normal and symmetric to light touch   ASSESSMENT [name] is a 79 y.o. male presenting with left sided  weakness and confusion. Imaging confirms a left lateral intraventricular hemorrhage. Hemorrhage felt to be secondary to combination of Hypertension and coumadin use.  On aspirin 81 mg orally every day and warfarin prior to admission. Now on no antithrombotics due to hemorrhage for secondary stroke prevention. Patient  with resultant right field cut, right  hemiparesis. Work up completed.  Protein malnutrition,  He was on oral diet but appears too stuporous to eat. Speech therapy unable to perform swallow test secondary to another procedure; however to see patient today. Suspect will need to place feeding tube. atrial fibrillation, on chronic coumadin prior to admission S/p valve replacement, on chronic coumadin prior to admission Hypertension S/p AAA repair CAD - s/p CABG No dementia at baseline Long term medication use  Hospital day # 9  TREATMENT/PLAN  Speech therapy to evaluate in am if unable to swallow will place peg.  Continue home BP medications, goal is SBP 180  Will need anti-coagulation at some point (afib and valve). Timing of coumadin, would start aspirin first, then wait 2-4 weeks to restart coumadin if hemorrhage stable, will be decided in the future.  Neurosurgery, following. Saw 12/05/2012 and not felt to be a surgical candidate.   Goal: CIR  I discussed plan of care with wife, daughter and Dr Sharon Seller.   Delia Heady,  MD

## 2012-12-10 LAB — BASIC METABOLIC PANEL
CO2: 24 mEq/L (ref 19–32)
Calcium: 8.7 mg/dL (ref 8.4–10.5)
Chloride: 121 mEq/L — ABNORMAL HIGH (ref 96–112)
Glucose, Bld: 132 mg/dL — ABNORMAL HIGH (ref 70–99)
Potassium: 3.2 mEq/L — ABNORMAL LOW (ref 3.5–5.1)
Sodium: 153 mEq/L — ABNORMAL HIGH (ref 135–145)

## 2012-12-10 LAB — GLUCOSE, CAPILLARY
Glucose-Capillary: 133 mg/dL — ABNORMAL HIGH (ref 70–99)
Glucose-Capillary: 137 mg/dL — ABNORMAL HIGH (ref 70–99)
Glucose-Capillary: 141 mg/dL — ABNORMAL HIGH (ref 70–99)

## 2012-12-10 MED ORDER — HYDRALAZINE HCL 20 MG/ML IJ SOLN
10.0000 mg | Freq: Four times a day (QID) | INTRAMUSCULAR | Status: DC | PRN
  Filled 2012-12-10: qty 1

## 2012-12-10 NOTE — Evaluation (Signed)
Clinical/Bedside Swallow Evaluation Patient Details  Name: Bryan Lam MRN: [id number] Date of Birth: 1928-03-21  Today's Date: 12/10/2012 Time: 1215-     Past Medical History:  Past Medical History  Diagnosis Date  . Hypertension   . AAA (abdominal aortic aneurysm)   . Gout   . Atrial fibrillation    Past Surgical History:  Past Surgical History  Procedure Laterality Date  . Coronary artery bypass graft    . Cardiac valve replacement    . Abdominal aortic aneurysm repair  02/14   HPI:  79 y.o. male with a past medical history significant for hypertension, CAD s/p CABG, s/p valve replacement, atrial fibrillation on coumadin, s/p AAA repair, transferred to [facility] from [facility] for further management of large intraventricular hemorrhage. Was very functional at home, and patient's wife called his son around 3 am today 11/30/2012 because he was feeling weak and was looking confused. Family report that then they noted that his left face was droopier than the right and the left arm was also weak. Family stated that he is getting more confused. Patient was taken to [facility] and had a CT brain that revealed a large Large amount of interventricular blood centered primarily within the atria of the left lateral ventricle at the level of the choroid plexus. There is minimal dilatation of the temporal horn of the left lateral ventricle and associated minimal (approximately 4 mm) of left to right midline shift. Trace amount of intraventricular blood is seen within the dependent portion of the right lateral ventricle. Family stated that he is getting more confused."  He was admitted to the neuro ICU for further evaluation and treatment.   Assessment / Plan / Recommendation Clinical Impression  Pt. awake, but not f/c's consistently.  Pt. with significantly delayed swallow initiation with puree and liquid, decreased laryngeal elevation palpated, and multiple swallows with each smaill bite/sip  indicate laryngeal residue.  Delayed cough and throat clears noted, as well as wet vocal quality after both puree and liquid.  Pt. was noted to have increased work of breathing, with RR of 30, which increases his aspiration risk.  Pt. is currently not ready for MBS or po's at this time, as he does not appear to tolerate even purees.  Pt. began coughing near end of this evaluation, and SLP suctioned applesauce from oropharynx.    Aspiration Risk  Severe    Diet Recommendation NPO;Alternative means - temporary   Medication Administration: Via alternative means    Other  Recommendations     Follow Up Recommendations       Frequency and Duration        Pertinent Vitals/Pain n/a    SLP Swallow Goals Goal #3: Pt. will complete MBS prior to diet being initiated.   Swallow Study Prior Functional Status       General HPI: 79 y.o. male with a past medical history significant for hypertension, CAD s/p CABG, s/p valve replacement, atrial fibrillation on coumadin, s/p AAA repair, transferred to [facility] from [facility] for further management of large intraventricular hemorrhage. Was very functional at home, and patient's wife called his son around 3 am today 11/30/2012 because he was feeling weak and was looking confused. Family report that then they noted that his left face was droopier than the right and the left arm was also weak. Family stated that he is getting more confused. Patient was taken to [facility] and had a CT brain that revealed a large Large amount of interventricular  blood centered primarily within the atria of the left lateral ventricle at the level of the choroid plexus. There is minimal dilatation of the temporal horn of the left lateral ventricle and associated minimal (approximately 4 mm) of left to right midline shift. Trace amount of intraventricular blood is seen within the dependent portion of the right lateral ventricle. Family stated that he is getting more  confused."  He was admitted to the neuro ICU for further evaluation and treatment. Type of Study: Bedside swallow evaluation Diet Prior to this Study: NPO Temperature Spikes Noted: Yes (Low grade fevers) Respiratory Status: Supplemental O2 delivered via (comment) History of Recent Intubation: No Behavior/Cognition: Alert;Distractible;Requires cueing;Decreased sustained attention;Doesn't follow directions Oral Cavity - Dentition: Adequate natural dentition Self-Feeding Abilities: Total assist Patient Positioning: Upright in bed Baseline Vocal Quality: Clear Volitional Cough: Cognitively unable to elicit Volitional Swallow: Unable to elicit    Oral/Motor/Sensory Function Overall Oral Motor/Sensory Function: Impaired Labial ROM: Reduced left Labial Symmetry: Abnormal symmetry left Labial Strength: Reduced Labial Sensation: Reduced Lingual ROM:  (Pt. did not f/c)   Ice Chips Ice chips: Impaired Presentation: Spoon Oral Phase Impairments:  (Chewed well) Pharyngeal Phase Impairments: Suspected delayed Swallow;Throat Clearing - Immediate;Throat Clearing - Delayed;Decreased hyoid-laryngeal movement;Wet Vocal Quality   Thin Liquid Thin Liquid: Impaired Presentation: Spoon Pharyngeal  Phase Impairments: Suspected delayed Swallow;Decreased hyoid-laryngeal movement;Multiple swallows;Wet Vocal Quality;Cough - Immediate;Throat Clearing - Delayed    Nectar Thick Nectar Thick Liquid: Not tested   Honey Thick Honey Thick Liquid: Not tested   Puree Puree: Impaired Presentation: Spoon Pharyngeal Phase Impairments: Suspected delayed Swallow;Decreased hyoid-laryngeal movement;Multiple swallows;Wet Vocal Quality   Solid   GO    Solid: Not tested       Bryan Lam T 12/10/2012,1:23 PM

## 2012-12-10 NOTE — Consult Note (Signed)
ANTIBIOTIC CONSULT NOTE   Pharmacy Consult for Vancomycin / Zosyn Indication: pneumonia  No Known Allergies  Labs:  Recent Labs  12/08/12 0500 12/09/12 0440 12/10/12 0540  WBC  --  8.8  --   HGB  --  11.6*  --   PLT  --  130*  --   CREATININE 1.15 1.22 1.14   Estimated Creatinine Clearance: 53.9 ml/min (by C-G formula based on Cr of 1.14).  Microbiology: Recent Results (from the past 720 hour(s))  MRSA PCR SCREENING     Status: Abnormal   Collection Time    11/30/12  8:50 PM      Result Value Range Status   MRSA by PCR POSITIVE (*) NEGATIVE Final   Comment:            The GeneXpert MRSA Assay (FDA     approved for NASAL specimens     only), is one component of a     comprehensive MRSA colonization     surveillance program. It is not     intended to diagnose MRSA     infection nor to guide or     monitor treatment for     MRSA infections.     RESULT CALLED TO, READ BACK BY AND VERIFIED WITH:     SHELTON,M RN [id number] AT 2245 SKEEN,P  CULTURE, BLOOD (ROUTINE X 2)     Status: None   Collection Time    12/04/12  7:18 PM      Result Value Range Status   Specimen Description BLOOD ARM RIGHT   Final   Special Requests BOTTLES DRAWN AEROBIC AND ANAEROBIC 10CC   Final   Culture  Setup Time 12/05/2012 04:12   Final   Culture     Final   Value:        BLOOD CULTURE RECEIVED NO GROWTH TO DATE CULTURE WILL BE HELD FOR 5 DAYS BEFORE ISSUING A FINAL NEGATIVE REPORT   Report Status PENDING   Incomplete  CULTURE, BLOOD (ROUTINE X 2)     Status: None   Collection Time    12/04/12  7:25 PM      Result Value Range Status   Specimen Description BLOOD HAND RIGHT   Final   Special Requests BOTTLES DRAWN AEROBIC ONLY 10CC   Final   Culture  Setup Time 12/05/2012 04:12   Final   Culture     Final   Value:        BLOOD CULTURE RECEIVED NO GROWTH TO DATE CULTURE WILL BE HELD FOR 5 DAYS BEFORE ISSUING A FINAL NEGATIVE REPORT   Report Status PENDING   Incomplete  URINE CULTURE      Status: None   Collection Time    12/06/12  9:07 AM      Result Value Range Status   Specimen Description URINE, CATHETERIZED   Final   Special Requests none Normal   Final   Culture  Setup Time 12/06/2012 09:41   Final   Colony Count NO GROWTH   Final   Culture NO GROWTH   Final   Report Status 12/07/2012 FINAL   Final    Medical History: Past Medical History  Diagnosis Date  . Hypertension   . AAA (abdominal aortic aneurysm)   . Gout   . Atrial fibrillation    Assessment: 85yom on coumadin pta for afib admitted with left ventricular hemorrhage.  Day 4 of broad spectrum antibiotics for pneumonia.  Afebrile, WBC stable.  Scr stable  Goal of Therapy:  Vancomycin trough level 15-20 mcg/ml Appropriate Zosyn dosing  Plan:  1) Continue vancomycin 750 mg iv Q 12 hours 2) Continue Zosyn 3.375 grams iv Q 8 hours 3) Follow up for dc 7/31 or 8/1 4) Follow renal function, cultures, trough at steady state  Thank you. Okey Regal, PharmD [phone]  12/10/2012,1:53 PM

## 2012-12-10 NOTE — Progress Notes (Addendum)
[facility] Progress Note [place] TEAM 1 - Stepdown/ICU TEAM   Bryan Lam GEX:[id number] DOB: 1928/04/03 DOA: 11/30/2012 PCP: No primary provider on file.  Brief narrative: 79 year old male who lives at home and has a history of atrial fibrillation on coumadin, aortic valve replacement with a porcine valve, CAD status post CABG, abdominal aortic aneurysm repair.   The patient presented to the hospital on 7/18 with weakness and confusion. He was initially taken to [facility] where a CT scan of the brain revealed interventricular blood. He was transferred to [facility] and admitted under the ICU service. A Neurosurgical consult was requested and no surgical intervention was recommended. He was also noted to have malignant hypertension and it is suspected that the intracranial bleed was secondary to uncontrolled blood pressure in the setting of Coumadin use.  SIGNIFICANT EVENTS / STUDIES:  7/18 CT Head>>>large amt of intraventricular blood on L, trace on R  7/21 Weaned off nicardipine infusion  7/21 CT head: Slight decrease in size of left ventricular hemorrhage with less dilatation of the temporal horn of the left lateral ventricle. No new intracranial hemorrhage or infarct identified. No midline shift.  7/22 More lethargic. SBP goal changed to 120 - 180 mmHg per Stroke Team  7/23 CT head: [facility]  7/23 NS consult: no indication for ventric drain @ present  7/28 - failed swallow eval - NG unable to be placed on multiple prior attempts (including in IR)  Assessment/Plan:  Left Intraventricular Intracranial Hemorrhage -Per Neurology, recommendations are to start aspirin first and if hemorrhage remains stable after waiting 2-4 weeks, then re-start Coumadin - not yet ready to start ASA at this time  -Therapy recommends eventual CIR if recovers -pt has not experienced any significant improvement in function or communication over the last 24hrs   Acute  encephalopathy Interfering with ability to eat -  EEG w/o evidence of Sz acitivity - mental status markedly improved today  Nutrition bedside feeding tube found to be malpositioned at GE jxn, and came out w/ attempts to advance - attempts to place feeding tube in Radiolgoy failed as well - failed SLP eval today - family does not feel pt would wish to have his current quality of life artificially prolonged, but has mixed feelings about this - I have requested a Palliative Care consult for goals of care with their permission   Fevers started on 7/22 - to 99.9 last PM - family noted cough and phlegm in mouth but CXR negative for pneumonia, and negative again on repeat 7/26 - fevers may be neurogenic - given trouble w/ secretions, aspiration pneumonitis considered likely - empiric coverage w/ zosyn and vanc (MRSA + on screen) to be continued for 5 days of tx   Malignant hypertension -BP controlled initially with nicardipine -Has been on IV hydralazine when necessary -Altace and Toprol have been on hold -care to avoid overcorrection, with primary goal to keep SBP <180 as defined by Neurology  Aortic valve replacement with a porcine valve uanble to safely anticoagulate at this time - see discussion above   AF (atrial fibrillation) with RVR -rate controlled at this time  -Anticoagulation on hold due to ICH  CAD status post CABG  Aspirin on hold  Hx of AAA repair  Mild hypokalemia  Replete via IV and cont to follow  Weakly + UA No growth on urine cx - will be more than adequately covered w/ abx receiving   MRSA screen +  Code Status: LIMITED -  NO CPR - NO DEFIB - YES to intubation, pressors, or antiarrythmics  Family Communication: Spoke with wife, son,  & daughter at bedside in detail Disposition Plan: SDU as pt continues to require IV BP meds carefully dosed in conjunction with close monitoring of his BP and heart rate in the setting of ICH  Consultants: Neuro Neuro surgery PCCM  >> [facility]  Palliative Care   Procedures: EEG - no evidence of Sz activity - 7/24   Antibiotics: Azithromycin 7/24 >> 7/25 Zosyn 7/25 >> Vancomycin 7/25 >>  DVT prophylaxis: SCDs  HPI/Subjective: Mental status is somewhat more depressed today. Pt will open his eyes and follow the examiner.  He will follow some simple commands, but not all.  He does not appear to be uncomfortable.    Objective: Blood pressure 193/98, pulse 64, temperature 99.4 F (37.4 C), temperature source Axillary, resp. rate 24, height 6' (1.829 m), weight 88.8 kg (195 lb 12.3 oz), SpO2 99.00%.  Intake/Output Summary (Last 24 hours) at 12/10/12 1704 Last data filed at 12/10/12 1500  Gross per 24 hour  Intake   1400 ml  Output    725 ml  Net    675 ml   Exam: General: No acute respiratory distress - much more alert Lungs: Coarse upper airway sounds transmitted throughout with no focal crackles or wheezes Cardiovascular: Regular rate and rhythm without gallop or rub - 2/6 holosystolic M appreciated  Abdomen: Nontender, nondistended, soft, bowel sounds positive, no rebound, no ascites, no appreciable mass Extremities: No significant cyanosis, clubbing, or edema bilateral lower extremities  Data Reviewed: Basic Metabolic Panel:  Recent Labs Lab 12/05/12 0355 12/06/12 0530 12/08/12 0500 12/09/12 0440 12/10/12 0540  NA 139 144 150* 154* 153*  K 3.7 3.2* 3.3* 3.4* 3.2*  CL 102 108 116* 121* 121*  CO2 26 25 25 26 24   GLUCOSE 125* 111* 144* 137* 132*  BUN 47* 51* 50* 47* 41*  CREATININE 0.96 1.05 1.15 1.22 1.14  CALCIUM 9.3 9.2 8.9 8.5 8.7  MG  --   --  2.3  --   --    Liver Function Tests:  Recent Labs Lab 12/04/12 1644 12/08/12 0500  AST 11 18  ALT 9 12  ALKPHOS 56 42  BILITOT 1.1 1.2  PROT 6.8 6.2  ALBUMIN 3.1* 2.8*   CBC:  Recent Labs Lab 12/04/12 1644 12/05/12 0355 12/06/12 0530 12/09/12 0440  WBC 10.6* 10.6* 10.7* 8.8  HGB 12.2* 11.8* 12.3* 11.6*  HCT 36.6* 36.2* 37.3* 36.5*   MCV 78.7 79.2 79.7 83.7  PLT 151 146* 181 130*   CBG:  Recent Labs Lab 12/09/12 0817 12/09/12 1139 12/10/12 0743 12/10/12 1200 12/10/12 1627  GLUCAP 131* 118* 108* 134* 133*    Recent Results (from the past 240 hour(s))  MRSA PCR SCREENING     Status: Abnormal   Collection Time    11/30/12  8:50 PM      Result Value Range Status   MRSA by PCR POSITIVE (*) NEGATIVE Final   Comment:            The GeneXpert MRSA Assay (FDA     approved for NASAL specimens     only), is one component of a     comprehensive MRSA colonization     surveillance program. It is not     intended to diagnose MRSA     infection nor to guide or     monitor treatment for     MRSA infections.  RESULT CALLED TO, READ BACK BY AND VERIFIED WITH:     SHELTON,M RN [id number] AT 2245 SKEEN,P  CULTURE, BLOOD (ROUTINE X 2)     Status: None   Collection Time    12/04/12  7:18 PM      Result Value Range Status   Specimen Description BLOOD ARM RIGHT   Final   Special Requests BOTTLES DRAWN AEROBIC AND ANAEROBIC 10CC   Final   Culture  Setup Time 12/05/2012 04:12   Final   Culture     Final   Value:        BLOOD CULTURE RECEIVED NO GROWTH TO DATE CULTURE WILL BE HELD FOR 5 DAYS BEFORE ISSUING A FINAL NEGATIVE REPORT   Report Status PENDING   Incomplete  CULTURE, BLOOD (ROUTINE X 2)     Status: None   Collection Time    12/04/12  7:25 PM      Result Value Range Status   Specimen Description BLOOD HAND RIGHT   Final   Special Requests BOTTLES DRAWN AEROBIC ONLY 10CC   Final   Culture  Setup Time 12/05/2012 04:12   Final   Culture     Final   Value:        BLOOD CULTURE RECEIVED NO GROWTH TO DATE CULTURE WILL BE HELD FOR 5 DAYS BEFORE ISSUING A FINAL NEGATIVE REPORT   Report Status PENDING   Incomplete  URINE CULTURE     Status: None   Collection Time    12/06/12  9:07 AM      Result Value Range Status   Specimen Description URINE, CATHETERIZED   Final   Special Requests none Normal   Final   Culture   Setup Time 12/06/2012 09:41   Final   Colony Count NO GROWTH   Final   Culture NO GROWTH   Final   Report Status 12/07/2012 FINAL   Final     Studies:  Recent x-ray studies have been reviewed in detail by the Attending Physician  Scheduled Meds:  Scheduled Meds: . antiseptic oral rinse  15 mL Mouth Rinse q12n4p  . chlorhexidine  15 mL Mouth Rinse BID  . metoprolol  5 mg Intravenous Q6H  . piperacillin-tazobactam (ZOSYN)  IV  3.375 g Intravenous Q8H  . vancomycin  750 mg Intravenous Q12H    Time spent on care of this patient: 25 minutes   [facility] T,MD  [facility] Office  [phone] Pager - Text Page per Loretha Stapler as per below:  On-Call/Text Page:      [email or web address]      password [email or web address]  If 7PM-7AM, please contact night-coverage [email or web address] Password [email or web address] 12/10/2012, 5:04 PM   LOS: 10 days

## 2012-12-10 NOTE — Progress Notes (Signed)
Physical Therapy Treatment Patient Details Name: Bryan Lam MRN: [id number] DOB: Jun 28, 1927 Today's Date: 12/10/2012 Time: 4098-1191 PT Time Calculation (min): 43 min  PT Assessment / Plan / Recommendation  History of Present Illness 79 y.o. male with a past medical history significant for hypertension, CAD s/p CABG, s/p valve replacement, atrial fibrillation on coumadin, s/p AAA repair, transferred to [facility] for further management of large left intraventricular hemorrhage. Pt less responsive than on eval.   Clinical Impression Pt with increased response to name and was able to maintain eye opening throughout entire session. Pt with smile in response to family taking to patient. Pt with extremely delayed response time but was able to move L UE and head to command. Pt remains flaccid on R UE/LE and demo's significant balance deficits, cognitive deficits, and requires increased assist for all mobility. Acute PT to con't to work with patient to advance all mobility. Con't to recommend CIR upon d/c for maximal functional recovery.   PT Comments     Follow Up Recommendations  CIR     Does the patient have the potential to tolerate intense rehabilitation     Barriers to Discharge        Equipment Recommendations   (TBA)    Recommendations for Other Services Rehab consult  Frequency Min 3X/week   Progress towards PT Goals Progress towards PT goals: Progressing toward goals  Plan Current plan remains appropriate    Precautions / Restrictions Precautions Precautions: Fall Restrictions Weight Bearing Restrictions: No   Pertinent Vitals/Pain Pt did not express pain    Mobility  Bed Mobility Bed Mobility: Rolling Right;Right Sidelying to Sit;Sit to Sidelying Right Rolling Right: 1: +2 Total assist Rolling Right: Patient Percentage: 10% Right Sidelying to Sit: HOB flat;With rails;1: +2 Total assist Right Sidelying to Sit: Patient Percentage: 0% Sitting - Scoot to Edge of Bed: 1: +2 Total  assist Sitting - Scoot to Edge of Bed: Patient Percentage: 0% Sit to Sidelying Right: 1: +2 Total assist;HOB flat Sit to Sidelying Right: Patient Percentage: 10% Scooting to HOB: 1: +2 Total assist Scooting to [facility]: Patient Percentage: 0% Details for Bed Mobility Assistance: Pt max tactilce and verbal cues pt did move L UE to attempt to assist with transfers however overall has minimal functional use x 4 extermities Transfers Transfers: Not assessed Ambulation/Gait Ambulation/Gait Assistance: Not tested (comment)    Exercises     PT Diagnosis:    PT Problem List:   PT Treatment Interventions:     PT Goals (current goals can now be found in the care plan section)    Visit Information  Last PT Received On: 12/10/12 Assistance Needed: +2 History of Present Illness: 79 y.o. male with a past medical history significant for hypertension, CAD s/p CABG, s/p valve replacement, atrial fibrillation on coumadin, s/p AAA repair, transferred to [facility] for further management of large left intraventricular hemorrhage. Pt less responsive than on eval.    Subjective Data  Subjective: Pt received supine in bed with family at bedside   Cognition  Cognition Arousal/Alertness: Lethargic Behavior During Therapy: WFL for tasks assessed/performed Overall Cognitive Status: Impaired/Different from baseline Area of Impairment: Orientation;Attention;Memory;Following commands;Safety/judgement;Awareness;Problem solving Orientation Level:  (difficult to assess due to expressive impairments) Current Attention Level: Sustained Safety/Judgement: Decreased awareness of deficits Awareness: Intellectual Problem Solving: Slow processing;Decreased initiation;Requires verbal cues;Difficulty sequencing;Requires tactile cues General Comments: pt with extremely delayed comprehension/noted expressive deficits however pt did smile multiple times t/o session to family and PT    Balance  Static  Sitting Balance Static Sitting  - Balance Support: Bilateral upper extremity supported Static Sitting - Level of Assistance: 1: +2 Total assist Static Sitting - Comment/# of Minutes: 15 min, worked on increasing sesory stimuli to R UE and R LE through [employer]. pt unable to hold head upright > 10 sec. maxA posterior to maintain upright position at EOB, assist at cervical extensors to maintain head upright  End of Session PT - End of Session Equipment Utilized During Treatment: Oxygen Activity Tolerance: Patient tolerated treatment well Patient left: in bed;with call bell/phone within reach;with family/visitor present (pt positioned in midline with R UE elevated) Nurse Communication: Mobility status   GP     Marcene Brawn 12/10/2012, 10:11 AM  Lewis Shock, PT, DPT Pager #: [phone] Office #: [phone]

## 2012-12-10 NOTE — Progress Notes (Signed)
Thank you for consulting the Palliative Medicine Team at [facility] to meet your patient's and family's needs.   The reason that you asked [place] to see your patient is for goals of care discussion  We have scheduled your patient for a meeting: Tuesday 7/29 @ 11:30 am  The Surrogate decision maker is: son Pax Reasoner ([phone]) and daughter Almira Coaster ([phone])  Other family members that need to be present: wife may be present - daughter expressed concern that this discussion will overwhelm her mother  Your patient is able/unable to participate: TBD - on this visit patient sleeping per notes very lethargic, slow to respond  Additional Narrative:  Informed by daughter Darl Pikes that her mother/ patient's wife has progressive Dementia, with worsening memory issues recently and since patient's hospitalization has been more overwhelmed- Darl Pikes and brother Brett Canales are requesting the staff speak to them before talking with their mother- she will plan to bring her mother tomorrow - Darl Pikes stated they had discussed and drawn up HCPOA papers prior to her dad's cardiac surgery but they were not notarized- she stated at that time he wanted her and her brother to make decisions about his care - asked if they still had that paperwork however she was not sure   Valente David, RN 12/10/2012, 2:35 PM Palliative Medicine Team RN Liaison [phone]

## 2012-12-10 NOTE — Progress Notes (Signed)
Stroke Team Progress Note  HISTORY 79 y.o. male with a past medical history significant for hypertension, CAD s/p CABG, s/p valve replacement, atrial fibrillation on coumadin, s/p AAA repair, transferred to [facility] from [facility] for further management of large intraventricular hemorrhage. Was very functional at home, and patient's wife called his son around 3 am today 11/30/2012 because he was feeling weak and was looking confused. Family report that then they noted that his left face was droopier than the right and the left arm was also weak. Family stated that he is getting more confused. Patient was taken to [facility] and had a CT brain that revealed a large Large amount of interventricular blood centered primarily within the atria of the left lateral ventricle at the level of the choroid plexus. There is minimal dilatation of the temporal horn of the left lateral ventricle and associated minimal (approximately 4 mm) of left to right midline shift. Trace amount of intraventricular blood is seen within the dependent portion of the right lateral ventricle. Family stated that he is getting more confused."  He was admitted to the neuro ICU for further evaluation and treatment.  SUBJECTIVE Family at bedside. Remains sleepy but can be aroused and will follow some commands.  OBJECTIVE Most recent Vital Signs: Filed Vitals:   12/09/12 1800 12/09/12 1925 12/09/12 2318 12/10/12 0343  BP: 159/86 178/87 170/95 186/98  Pulse: 43 68 71 64  Temp:  97.9 F (36.6 C) 99.9 F (37.7 C) 98 F (36.7 C)  TempSrc:  Oral Oral Oral  Resp: 26 23 31 29   Height:      Weight:    88.8 kg (195 lb 12.3 oz)  SpO2: 98% 100% 95% 100%   CBG (last 3)   Recent Labs  12/08/12 1754 12/09/12 0817 12/09/12 1139  GLUCAP 112* 131* 118*    IV Fluid Intake:   . dextrose 5 % and 0.45% NaCl 100 mL/hr at 12/09/12 1143    MEDICATIONS  . antiseptic oral rinse  15 mL Mouth Rinse q12n4p  . chlorhexidine  15 mL Mouth  Rinse BID  . metoprolol  5 mg Intravenous Q6H  . piperacillin-tazobactam (ZOSYN)  IV  3.375 g Intravenous Q8H  . vancomycin  750 mg Intravenous Q12H   PRN:  acetaminophen  Diet:  NPO  Activity:  Bedrest DVT Prophylaxis:  SCDs   CLINICALLY SIGNIFICANT STUDIES Basic Metabolic Panel:   Recent Labs Lab 12/08/12 0500 12/09/12 0440 12/10/12 0540  NA 150* 154* 153*  K 3.3* 3.4* 3.2*  CL 116* 121* 121*  CO2 25 26 24   GLUCOSE 144* 137* 132*  BUN 50* 47* 41*  CREATININE 1.15 1.22 1.14  CALCIUM 8.9 8.5 8.7  MG 2.3  --   --    Liver Function Tests:   Recent Labs Lab 12/04/12 1644 12/08/12 0500  AST 11 18  ALT 9 12  ALKPHOS 56 42  BILITOT 1.1 1.2  PROT 6.8 6.2  ALBUMIN 3.1* 2.8*   CBC:   Recent Labs Lab 12/06/12 0530 12/09/12 0440  WBC 10.7* 8.8  HGB 12.3* 11.6*  HCT 37.3* 36.5*  MCV 79.7 83.7  PLT 181 130*   Coagulation:  No results found for this basename: LABPROT, INR,  in the last 168 hours Cardiac Enzymes: No results found for this basename: CKTOTAL, CKMB, CKMBINDEX, TROPONINI,  in the last 168 hours Urinalysis:   Recent Labs Lab 12/04/12 1845  COLORURINE AMBER*  LABSPEC 1.022  PHURINE 5.5  GLUCOSEU NEGATIVE  HGBUR SMALL*  BILIRUBINUR SMALL*  KETONESUR NEGATIVE  PROTEINUR 30*  UROBILINOGEN 1.0  NITRITE NEGATIVE  LEUKOCYTESUR SMALL*   Lipid Panel No results found for this basename: chol,  trig,  hdl,  cholhdl,  vldl,  ldlcalc   HgbA1C  No results found for this basename: HGBA1C   Urine Drug Screen:   No results found for this basename: labopia,  cocainscrnur,  labbenz,  amphetmu,  thcu,  labbarb    Alcohol Level: No results found for this basename: ETH,  in the last 168 hours   CT of the brain   12/04/2012 Stable appearance of large left and small right intraventricular hemorrhage. Stable low attenuation edema in the left periventricular white matter. No significant change since previous study. 12/03/2012 Slight interval decrease in size  of left ventricular hemorrhage with less dilatation of the temporal horn of the left lateral  ventricle. No new intracranial hemorrhage or infarct identified. No midline shift 12/01/2012    Stable.  Intraventricular hemorrhage centered at the left lateral ventricle, with some degree of trapping of that ventricle, and left hemisphere edema.  No new intracranial abnormality.     12/01/2012   Stable size and appearance of predominately left-sided intraventricular hemorrhage with stable to slightly increased prominence of the lateral ventricles as compared to the prior study.    11/30/2012     1.  Large amount of interventricular blood centered primarily within the atria of the left lateral ventricle at the level of the choroid plexus. The etiology of this hemorrhage is indeterminate but could be the sequela of trauma, hypercoagulable state and/or choroid plexus vascular malformation. 2.  The amount of left-sided intraventricular blood results in minimal dilatation of the temporal horn of the left lateral ventricle and associated minimal (approximately 4 mm) of left to right midline shift.  3.  Trace amount of intraventricular blood is seen within the dependent portion of the right lateral ventricle.       CXR   12/03/2012    1.  Moderate cardiomegaly with pulmonary venous congestion, but no frank pulmonary edema. 2.  Low lung volumes with minimal bibasilar subsegmental atelectasis. 3.  Atherosclerosis.  12/01/2012    No evidence of acute infiltrate.    11/30/2012   1.  Cardiac enlargement. 2.  Suspect left pleural effusion. 3.  Infiltrate versus atelectasis in the left base.    EEG  abnormal with mild generalized nonspecific slowing of cerebral activity, which can be seen with a wide variety of encephalopathic processes including degenerative as well as metabolic encephalopathy. No evidence of an epileptic disorder was recorded.  EKG     Therapy Recommendations CIR   GENERAL EXAM:  Patient is in no  distress  CARDIOVASCULAR:  Regular rate and rhythm, no murmurs, no carotid bruits  NEUROLOGIC:  MENTAL STATUS: Awake. SAYS SOME WORDS. SLOW RESPONSES. FOLLOWS COMMANDS INTERMITTENTLY.  CRANIAL NERVE: pupils (R 2.5, L 2, POST SURGICAL, MILD RXN), visual fields full to confrontation, extraocular muscles intact, no nystagmus, facial sensation and strength symmetric, uvula midline, shoulder shrug symmetric, tongue midline.  MOTOR: RUE 2, LUE 3, RLE 1, LLE 3.  SENSORY: normal and symmetric to light touch   ASSESSMENT [name] is a 79 y.o. male presenting with left sided  weakness and confusion. Imaging confirms a left lateral intraventricular hemorrhage. Hemorrhage felt to be secondary to combination of Hypertension and coumadin use.  On aspirin 81 mg orally every day and warfarin prior to admission. Now on no antithrombotics due to hemorrhage for secondary  stroke prevention. Patient with resultant right field cut, right  hemiparesis. Work up completed.  Protein malnutrition,  He was on oral diet but appears too stuporous to eat. Speech therapy unable to perform swallow test over weekend due to patient lethargy; however to see patient today. Suspect will need to place feeding tube. atrial fibrillation, on chronic coumadin prior to admission S/p valve replacement, on chronic coumadin prior to admission Hypertension S/p AAA repair CAD - s/p CABG No dementia at baseline Long term medication use  Hospital day # 10  TREATMENT/PLAN  Speech therapy so far unable to evaluated due to patient lethargy. Speech therapy today. Family reluctant to have feeding tube placed.  Continue home BP medications, goal is SBP 180  Will need anti-coagulation at some point (afib and valve). Timing of coumadin, would start aspirin first, then wait 2-4 weeks to restart coumadin if hemorrhage stable, will be decided in the future.  Neurosurgery, following. Saw 12/05/2012 and not felt to be a surgical  candidate.  Goal: CIR  D/w Dr Osborn Coho D. Manson Passey, [facility], MBA, MHA [facility] Pager: [phone] 12/10/2012 9:23 AM   Delia Heady, MD

## 2012-12-11 DIAGNOSIS — R0600 Dyspnea, unspecified: Secondary | ICD-10-CM

## 2012-12-11 DIAGNOSIS — R52 Pain, unspecified: Secondary | ICD-10-CM

## 2012-12-11 DIAGNOSIS — K1370 Unspecified lesions of oral mucosa: Secondary | ICD-10-CM

## 2012-12-11 DIAGNOSIS — R0609 Other forms of dyspnea: Secondary | ICD-10-CM

## 2012-12-11 DIAGNOSIS — K117 Disturbances of salivary secretion: Secondary | ICD-10-CM

## 2012-12-11 DIAGNOSIS — Z515 Encounter for palliative care: Secondary | ICD-10-CM

## 2012-12-11 DIAGNOSIS — R0989 Other specified symptoms and signs involving the circulatory and respiratory systems: Secondary | ICD-10-CM

## 2012-12-11 LAB — GLUCOSE, CAPILLARY

## 2012-12-11 LAB — CULTURE, BLOOD (ROUTINE X 2): Culture: NO GROWTH

## 2012-12-11 LAB — BASIC METABOLIC PANEL
Chloride: 117 mEq/L — ABNORMAL HIGH (ref 96–112)
Creatinine, Ser: 1.76 mg/dL — ABNORMAL HIGH (ref 0.50–1.35)
GFR calc Af Amer: 39 mL/min — ABNORMAL LOW (ref >90)
GFR calc non Af Amer: 34 mL/min — ABNORMAL LOW (ref >90)
Potassium: 3.3 mEq/L — ABNORMAL LOW (ref 3.5–5.1)

## 2012-12-11 MED ORDER — SODIUM CHLORIDE 0.9 % IV SOLN: INTRAVENOUS | Status: DC

## 2012-12-11 MED ORDER — MORPHINE SULFATE (CONCENTRATE) 10 MG /0.5 ML PO SOLN: 5.0000 mg | ORAL | Status: DC | PRN

## 2012-12-11 MED ORDER — LORAZEPAM 2 MG/ML IJ SOLN
1.0000 mg | INTRAMUSCULAR | Status: DC | PRN
  Administered 2012-12-12: 1 mg via INTRAVENOUS
  Filled 2012-12-11: qty 1

## 2012-12-11 MED ORDER — ATROPINE SULFATE 1 % OP SOLN
2.0000 [drp] | OPHTHALMIC | Status: DC | PRN
  Administered 2012-12-12: 2 [drp] via SUBLINGUAL
  Filled 2012-12-11: qty 2

## 2012-12-11 MED ORDER — BISACODYL 10 MG RE SUPP: 10.0000 mg | Freq: Every day | RECTAL | Status: DC | PRN

## 2012-12-11 MED ORDER — VANCOMYCIN HCL IN DEXTROSE 1-5 GM/200ML-% IV SOLN
1000.0000 mg | INTRAVENOUS | Status: DC
  Filled 2012-12-11: qty 200

## 2012-12-11 NOTE — Progress Notes (Signed)
NUTRITION FOLLOW UP  Intervention:    If EN desired, recommend initiating Jevity 1.2 formula at 15 ml/hr and increase by 10 ml every 4 hours to goal rate of 65 ml/hr to provide 1872 kcals, 87 gm protein, 1259 ml of free water RD to follow for nutrition care plan  Nutrition Dx:   Inadequate oral intake now related to severe dysphagia as evidenced by NPO status, ongoing  Goal:   Patient to meet >/= 90% of their estimated nutrition needs, unmet  Monitor:   PO intake, EN initiation, goals care, weight, labs, I/O's  Assessment:   Patient with PMH of HTN, AAA s/p repair, gout, CABG with valve replacement on coumadin presented with weakness and change in mental status to ER; found to have CT with large amount of intraventricular blood.  Chart reviewed.  Bedside NGT placement unsuccessful.  Bedside swallow evaluation completed 7/28 ---> SLP recommending continued NPO status.  Possible PEG tube placement.  Goals of care meeting today with family.  Height: Ht Readings from Last 1 Encounters:  12/06/12 6' (1.829 m)    Weight Status:   Wt Readings from Last 1 Encounters:  12/11/12 196 lb 13.9 oz (89.3 kg)    Re-estimated needs:  Kcal: 1800-2000 Protein: 90-100 gm Fluid: 1.8-2.0 L  Skin: Intact  Diet Order: NPO   Intake/Output Summary (Last 24 hours) at 12/11/12 1305 Last data filed at 12/11/12 1200  Gross per 24 hour  Intake   2200 ml  Output    500 ml  Net   1700 ml    Labs:   Recent Labs Lab 12/08/12 0500 12/09/12 0440 12/10/12 0540 12/11/12 0405  NA 150* 154* 153* 149*  K 3.3* 3.4* 3.2* 3.3*  CL 116* 121* 121* 117*  CO2 25 26 24 22   BUN 50* 47* 41* 44*  CREATININE 1.15 1.22 1.14 1.76*  CALCIUM 8.9 8.5 8.7 8.5  MG 2.3  --   --   --   GLUCOSE 144* 137* 132* 149*    CBG (last 3)   Recent Labs  12/10/12 2352 12/11/12 0429 12/11/12 0755  GLUCAP 141* 135* 147*    Scheduled Meds: . antiseptic oral rinse  15 mL Mouth Rinse q12n4p  . chlorhexidine  15 mL  Mouth Rinse BID  . metoprolol  5 mg Intravenous Q6H  . piperacillin-tazobactam (ZOSYN)  IV  3.375 g Intravenous Q8H  . vancomycin  1,000 mg Intravenous Q24H    Continuous Infusions: . dextrose 5 % and 0.45% NaCl 100 mL/hr at 12/11/12 0514    Maureen Chatters, RD, LDN Pager #: [phone] After-Hours Pager #: [phone]

## 2012-12-11 NOTE — Progress Notes (Signed)
Stroke Team Progress Note  HISTORY 79 y.o. male with a past medical history significant for hypertension, CAD s/p CABG, s/p valve replacement, atrial fibrillation on coumadin, s/p AAA repair, transferred to [facility] from [facility] for further management of large intraventricular hemorrhage. Was very functional at home, and patient's wife called his son around 3 am today 11/30/2012 because he was feeling weak and was looking confused. Family report that then they noted that his left face was droopier than the right and the left arm was also weak. Family stated that he is getting more confused. Patient was taken to [facility] and had a CT brain that revealed a large Large amount of interventricular blood centered primarily within the atria of the left lateral ventricle at the level of the choroid plexus. There is minimal dilatation of the temporal horn of the left lateral ventricle and associated minimal (approximately 4 mm) of left to right midline shift. Trace amount of intraventricular blood is seen within the dependent portion of the right lateral ventricle. Family stated that he is getting more confused."  He was admitted to the neuro ICU for further evaluation and treatment.  SUBJECTIVE  Patient in bed. Arouses to voice. Family at bedside.    OBJECTIVE Most recent Vital Signs: Filed Vitals:   12/10/12 2013 12/11/12 0023 12/11/12 0200 12/11/12 0500  BP:   174/118   Pulse:   47   Temp: 98 F (36.7 C) 98.7 F (37.1 C)    TempSrc: Oral Oral    Resp:   38   Height:      Weight:    89.3 kg (196 lb 13.9 oz)  SpO2:   98%    CBG (last 3)   Recent Labs  12/10/12 2015 12/10/12 2352 12/11/12 0429  GLUCAP 137* 141* 135*    IV Fluid Intake:   . dextrose 5 % and 0.45% NaCl 100 mL/hr at 12/11/12 0514    MEDICATIONS  . antiseptic oral rinse  15 mL Mouth Rinse q12n4p  . chlorhexidine  15 mL Mouth Rinse BID  . metoprolol  5 mg Intravenous Q6H  . piperacillin-tazobactam (ZOSYN)  IV   3.375 g Intravenous Q8H  . vancomycin  750 mg Intravenous Q12H   PRN:  acetaminophen, hydrALAZINE  Diet:  NPO ---failed speech Activity:  Bedrest DVT Prophylaxis:  SCDs   CLINICALLY SIGNIFICANT STUDIES Basic Metabolic Panel:   Recent Labs Lab 12/08/12 0500  12/10/12 0540 12/11/12 0405  NA 150*  < > 153* 149*  K 3.3*  < > 3.2* 3.3*  CL 116*  < > 121* 117*  CO2 25  < > 24 22  GLUCOSE 144*  < > 132* 149*  BUN 50*  < > 41* 44*  CREATININE 1.15  < > 1.14 1.76*  CALCIUM 8.9  < > 8.7 8.5  MG 2.3  --   --   --   < > = values in this interval not displayed. Liver Function Tests:   Recent Labs Lab 12/04/12 1644 12/08/12 0500  AST 11 18  ALT 9 12  ALKPHOS 56 42  BILITOT 1.1 1.2  PROT 6.8 6.2  ALBUMIN 3.1* 2.8*  d CBC:   Recent Labs Lab 12/06/12 0530 12/09/12 0440  WBC 10.7* 8.8  HGB 12.3* 11.6*  HCT 37.3* 36.5*  MCV 79.7 83.7  PLT 181 130*   Coagulation:  No results found for this basename: LABPROT, INR,  in the last 168 hours Cardiac Enzymes: No results found for this basename:  CKTOTAL, CKMB, CKMBINDEX, TROPONINI,  in the last 168 hours Urinalysis:   Recent Labs Lab 12/04/12 1845  COLORURINE AMBER*  LABSPEC 1.022  PHURINE 5.5  GLUCOSEU NEGATIVE  HGBUR SMALL*  BILIRUBINUR SMALL*  KETONESUR NEGATIVE  PROTEINUR 30*  UROBILINOGEN 1.0  NITRITE NEGATIVE  LEUKOCYTESUR SMALL*   Lipid Panel No results found for this basename: chol,  trig,  hdl,  cholhdl,  vldl,  ldlcalc   HgbA1C  No results found for this basename: HGBA1C   Urine Drug Screen:   No results found for this basename: labopia,  cocainscrnur,  labbenz,  amphetmu,  thcu,  labbarb    Alcohol Level: No results found for this basename: ETH,  in the last 168 hours   CT of the brain   12/04/2012 Stable appearance of large left and small right intraventricular hemorrhage. Stable low attenuation edema in the left periventricular white matter. No significant change since previous study. 12/03/2012  Slight interval decrease in size of left ventricular hemorrhage with less dilatation of the temporal horn of the left lateral  ventricle. No new intracranial hemorrhage or infarct identified. No midline shift 12/01/2012    Stable.  Intraventricular hemorrhage centered at the left lateral ventricle, with some degree of trapping of that ventricle, and left hemisphere edema.  No new intracranial abnormality.     12/01/2012   Stable size and appearance of predominately left-sided intraventricular hemorrhage with stable to slightly increased prominence of the lateral ventricles as compared to the prior study.    11/30/2012     1.  Large amount of interventricular blood centered primarily within the atria of the left lateral ventricle at the level of the choroid plexus. The etiology of this hemorrhage is indeterminate but could be the sequela of trauma, hypercoagulable state and/or choroid plexus vascular malformation. 2.  The amount of left-sided intraventricular blood results in minimal dilatation of the temporal horn of the left lateral ventricle and associated minimal (approximately 4 mm) of left to right midline shift.  3.  Trace amount of intraventricular blood is seen within the dependent portion of the right lateral ventricle.       CXR   12/03/2012    1.  Moderate cardiomegaly with pulmonary venous congestion, but no frank pulmonary edema. 2.  Low lung volumes with minimal bibasilar subsegmental atelectasis. 3.  Atherosclerosis.  12/01/2012    No evidence of acute infiltrate.    11/30/2012   1.  Cardiac enlargement. 2.  Suspect left pleural effusion. 3.  Infiltrate versus atelectasis in the left base.    EEG  abnormal with mild generalized nonspecific slowing of cerebral activity, which can be seen with a wide variety of encephalopathic processes including degenerative as well as metabolic encephalopathy. No evidence of an epileptic disorder was recorded.  EKG     Therapy Recommendations CIR ---failed  speech  GENERAL EXAM:  Patient is in no distress  CARDIOVASCULAR:  Regular rate and rhythm, no murmurs, no carotid bruits  NEUROLOGIC:  MENTAL STATUS: Awake. SAYS SOME WORDS. SLOW RESPONSES. FOLLOWS COMMANDS INTERMITTENTLY.  CRANIAL NERVE: pupils (R 2.5, L 2, POST SURGICAL, MILD RXN), visual fields full to confrontation, extraocular muscles intact, no nystagmus, facial sensation and strength symmetric, uvula midline, shoulder shrug symmetric, tongue midline.  MOTOR: RUE 2, LUE 3, RLE 1, LLE 3.  SENSORY: normal and symmetric to light touch   ASSESSMENT [name] is a 79 y.o. male presenting with left sided  weakness and confusion. Imaging confirms a left lateral intraventricular  hemorrhage. Hemorrhage felt to be secondary to combination of Hypertension and coumadin use.  On aspirin 81 mg orally every day and warfarin prior to admission. Now on no antithrombotics due to hemorrhage for secondary stroke prevention. Patient with resultant right field cut, right  hemiparesis. Work up completed.  Protein malnutrition, possible PEG. Family to discuss. atrial fibrillation, on chronic coumadin prior to admission S/p valve replacement, on chronic coumadin prior to admission Hypertension S/p AAA repair CAD - s/p CABG No dementia at baseline Long term medication use  Hospital day # 11  TREATMENT/PLAN  Failed swallow test. Family will discuss  Palliative Care to see patient.  Continue home BP medications, goal is SBP 180  Will need anti-coagulation at some point (afib and valve). Timing of coumadin, would start aspirin first, then wait 2-4 weeks to restart coumadin if hemorrhage stable, will be decided in the future.  Neurosurgery, following. Saw 12/05/2012 and not felt to be a surgical candidate.  Gwendolyn Lima. Manson Passey, [facility], MBA, MHA [facility] Pager: [phone] 12/11/2012 7:51 AM  I have asked to reexamine this patient, reviewed chart and data and discuss with family at  the bedside and answered questions Delia Heady, MD

## 2012-12-11 NOTE — Progress Notes (Signed)
[facility] Progress Note [place] TEAM 1 - Stepdown/ICU TEAM   Kiet Geer ZOX:[id number] DOB: [birth year] DOA: 11/30/2012 PCP: No primary provider on file.  Brief narrative: 79 year old male who lives at home and has a history of atrial fibrillation on coumadin, aortic valve replacement with a porcine valve, CAD status post CABG, abdominal aortic aneurysm repair.   The patient presented to the hospital on 7/18 with weakness and confusion. He was initially taken to [facility] where a CT scan of the brain revealed interventricular blood. He was transferred to [facility] and admitted under the ICU service. A Neurosurgical consult was requested and no surgical intervention was recommended. He was also noted to have malignant hypertension and it is suspected that the intracranial bleed was secondary to uncontrolled blood pressure in the setting of Coumadin use.  SIGNIFICANT EVENTS / STUDIES:  7/18 CT Head>>>large amt of intraventricular blood on L, trace on R  7/21 Weaned off nicardipine infusion  7/21 CT head: Slight decrease in size of left ventricular hemorrhage with less dilatation of the temporal horn of the left lateral ventricle. No new intracranial hemorrhage or infarct identified. No midline shift.  7/22 More lethargic. SBP goal changed to 120 - 180 mmHg per Stroke Team  7/23 CT head: [facility]  7/23 NS consult: no indication for ventric drain @ present  7/28 - failed swallow eval - NG unable to be placed on multiple prior attempts (including in IR)  Assessment/Plan:  Left Intraventricular Intracranial Hemorrhage -pt has not experienced any significant improvement in function or communication over the last 24hrs   Acute encephalopathy Interfering with ability to eat -  EEG w/o evidence of Sz acitivity - awakens to repeated stimulus but still seems overall very lethargic  Nutrition bedside feeding tube found to be malpositioned at GE jxn, and came out w/  attempts to advance - attempts to place feeding tube in Radiolgoy failed as well - failed SLP eval - family does not want a permanant feeding tube  Fevers started on 7/22 with TM 102 - 99.6 TM (7/28) - family noted cough and phlegm in mouth but CXR negative for pneumonia, and negative again on repeat 7/26 - fevers may be neurogenic - given trouble w/ secretions, aspiration pneumonitis considered likely - empiric coverage w/ zosyn and vanc (MRSA + on screen)  for 5 days of tx - d/c today  Malignant hypertension -BP controlled initially with nicardipine -Has been on IV hydralazine when necessary -Altace and Toprol have been on hold  Aortic valve replacement with a porcine valve uanble to safely anticoagulate at this time - see discussion above   AF (atrial fibrillation) with RVR -rate controlled at this time  -Anticoagulation on hold due to ICH  CAD status post CABG  Aspirin on hold  Hx of AAA repair  Mild hypokalemia   Weakly + UA No growth on urine cx   MRSA screen +  Code Status: LIMITED - NO CPR - NO DEFIB - YES to intubation, pressors, or antiarrythmics  Family Communication: No family at bedside this am Disposition Plan: will be transferred to 6 N due to treatment plan change to comfort care.   Consultants: Neuro Neuro surgery PCCM >> [facility]  Palliative Care   Procedures: EEG - no evidence of Sz activity - 7/24   Antibiotics: Azithromycin 7/24 >> 7/25 Zosyn 7/25 >> Vancomycin 7/25 >>  DVT prophylaxis: SCDs  HPI/Subjective: Awakens after repeated stimulus but non verbal and drifts off to sleep easily  Objective: Blood pressure 126/81, pulse 61, temperature 99.6 F (37.6 C), temperature source Oral, resp. rate 32, height 6' (1.829 m), weight 89.3 kg (196 lb 13.9 oz), SpO2 97.00%.  Intake/Output Summary (Last 24 hours) at 12/11/12 1128 Last data filed at 12/11/12 0900  Gross per 24 hour  Intake   2200 ml  Output    500 ml  Net   1700 ml   Exam: General:  No acute respiratory distress - lethargic Lungs: Coarse upper airway sounds transmitted throughout with no focal crackles or wheezes-2L Cardiovascular: Regular rate and rhythm without gallop or rub - 2/6 holosystolic M appreciated  Abdomen: Nontender, nondistended, soft, bowel sounds positive, no rebound, no ascites, no appreciable mass Extremities: No significant cyanosis, clubbing, or edema bilateral lower extremities  Data Reviewed: Basic Metabolic Panel:  Recent Labs Lab 12/06/12 0530 12/08/12 0500 12/09/12 0440 12/10/12 0540 12/11/12 0405  NA 144 150* 154* 153* 149*  K 3.2* 3.3* 3.4* 3.2* 3.3*  CL 108 116* 121* 121* 117*  CO2 25 25 26 24 22   GLUCOSE 111* 144* 137* 132* 149*  BUN 51* 50* 47* 41* 44*  CREATININE 1.05 1.15 1.22 1.14 1.76*  CALCIUM 9.2 8.9 8.5 8.7 8.5  MG  --  2.3  --   --   --    Liver Function Tests:  Recent Labs Lab 12/04/12 1644 12/08/12 0500  AST 11 18  ALT 9 12  ALKPHOS 56 42  BILITOT 1.1 1.2  PROT 6.8 6.2  ALBUMIN 3.1* 2.8*   CBC:  Recent Labs Lab 12/04/12 1644 12/05/12 0355 12/06/12 0530 12/09/12 0440  WBC 10.6* 10.6* 10.7* 8.8  HGB 12.2* 11.8* 12.3* 11.6*  HCT 36.6* 36.2* 37.3* 36.5*  MCV 78.7 79.2 79.7 83.7  PLT 151 146* 181 130*   CBG:  Recent Labs Lab 12/10/12 1200 12/10/12 1627 12/10/12 2015 12/10/12 2352 12/11/12 0429  GLUCAP 134* 133* 137* 141* 135*    Recent Results (from the past 240 hour(s))  CULTURE, BLOOD (ROUTINE X 2)     Status: None   Collection Time    12/04/12  7:18 PM      Result Value Range Status   Specimen Description BLOOD ARM RIGHT   Final   Special Requests BOTTLES DRAWN AEROBIC AND ANAEROBIC 10CC   Final   Culture  Setup Time 12/05/2012 04:12   Final   Culture NO GROWTH 5 DAYS   Final   Report Status 12/11/2012 FINAL   Final  CULTURE, BLOOD (ROUTINE X 2)     Status: None   Collection Time    12/04/12  7:25 PM      Result Value Range Status   Specimen Description BLOOD HAND RIGHT   Final    Special Requests BOTTLES DRAWN AEROBIC ONLY 10CC   Final   Culture  Setup Time 12/05/2012 04:12   Final   Culture NO GROWTH 5 DAYS   Final   Report Status 12/11/2012 FINAL   Final  URINE CULTURE     Status: None   Collection Time    12/06/12  9:07 AM      Result Value Range Status   Specimen Description URINE, CATHETERIZED   Final   Special Requests none Normal   Final   Culture  Setup Time 12/06/2012 09:41   Final   Colony Count NO GROWTH   Final   Culture NO GROWTH   Final   Report Status 12/07/2012 FINAL   Final     Studies:  Recent  x-ray studies have been reviewed in detail by the Attending Physician  Scheduled Meds:  Scheduled Meds: . antiseptic oral rinse  15 mL Mouth Rinse q12n4p  . chlorhexidine  15 mL Mouth Rinse BID  . metoprolol  5 mg Intravenous Q6H  . piperacillin-tazobactam (ZOSYN)  IV  3.375 g Intravenous Q8H  . vancomycin  1,000 mg Intravenous Q24H    Time spent on care of this patient: 25 minutes   ELLIS,ALLISON L., ANP  [facility] Office  [phone] Pager - Text Page per Loretha Stapler as per below:  On-Call/Text Page:      [email or web address]      password [email or web address]  If 7PM-7AM, please contact night-coverage [email or web address] Password [email or web address] 12/11/2012, 11:28 AM   LOS: 11 days      I have examined the patient, reviewed the chart and modified the above note which I agree with.   Yoshimi Sarr,MD [phone] 12/11/2012, 3:46 PM

## 2012-12-11 NOTE — Consult Note (Signed)
Patient [name]      DOB: 1928/04/17      EAV:[id number]     Consult Note from the Palliative Medicine Team at [facility]    Consult Requested by: Dr Sharon Seller     PCP: No primary provider on file. Reason for Consultation:Goals of Care     Phone Number:None  Assessment of patients Current state: Patient is semi-lethargic, does open eyes to tactile/verbal stimuli. Per SLP re-evaluation today indicates patient remains at high risk for aspiration.  Reviewed chart, spoke with staff caring for patient and then proceeded to have Palliative Care goals of care meeting with following family members present: patients spouse (has progressive dementia), patients daughter, Bryan Lam, and son, Bryan Lam, and his wife Bryan Lam.  We discussed patients medical condition, and had a focused conversation on patients inability to take in oral nutrition and fluids. Per family he was fairly functional before this acute event, and he would not want to kept alive by artificial feeding interventions. We then talked about continuing trajectory of patients decline, without ability to take in oral nutrition. We then discussed options for care, and they decided that evaluation and transition to residential hospice setting would be the appropriate choice.  Discussed the philosophy of palliative care and hospice support and services provided. Also engaged in decision-making with family regarding concepts of Medical Orders for Scope of Treatment pertaining to cardiac and pulmonary resuscitation, desire for acute future medical interventions for issues, the use of antibiotic therapy, intravenous hydration and continuing artificial tube feedings. A MOST form was completed and placed on chart with DNR form.  Dr Butler Denmark was called with family decision, Case manager offered residential hospice choice (family choose [facility]). Transfer orders were placed.  Goals of Care: 1.  Code  Status:DNR/DNI   Upon transfer from critical care to medical unit family desires focus to be on comfort and symptom manamgement  2. Scope of Treatment: 1. Vital Signs: routine 2. Respiratory/Oxygen: support as indicated for symptom management 3. Nutritional Support/Tube Feeds: No 4. Antibiotics: Discontinue 5. Blood Products:No 6. IVF: decrease to KVO rate until discharge 7. Review of Medications to be discontinued: Yes 8. Labs: Discontinue 9. Telemetry: Discontinue  10. Consults: Social work to offer residential hospice choice   4. Disposition: Recommend transition to residential hospice setting   3. Symptom Management:   1. Anxiety/Agitation: Ativan IV 1 mg every 4 hours as needed 2. Pain/Dyspnea: Roxanol SL 5 mg every 3 hours as needed 3. Bowel Regimen: Dulcolax suppository daily as needed 4. Fever: Tylenol suppository 650 mg every 6 hours as neeeded 5. Terminal Secretions: Atropine drops 2 SL every 4 hours as needed 6. Dysphagia: comfort feeding with known risk of aspiration-suction as needed-oral mouth care  4. Psychosocial:     Patient lived with wife of 50 years, per son and daughter patients spouse has progressive dementia, and is unable to make informed decisions regarding patients care. Son and daughter had previous intent to become HPOA' for father in recent past, paperwork was completed but never notarized, daughter was unsuccessful in finding prior advanced directives.  5. Spiritual: consult placed   Patient Documents Completed or Given: Document Given Completed  Advanced Directives Pkt    MOST  Yes  DNR  Yes  Gone from My Sight    Hard Choices Yes     Brief HPI: 79 yo WM, with PMH: A-fib, aortic valve replacement, CAD (s/p CABG), abdominal aneurysm repair. Taken to [facility] on 7/18 from home  with weakness and confusion, CT scan revealed IV bleed, transferred to [facility]. Neuro evaluated no surgery recommended. Since patient has not been able to take in  oral food or fluids, SLP evaluation indicates severe risk for aspiration.   ROS: unable to elicit, patient non-responsive    PMH:  Past Medical History  Diagnosis Date  . Hypertension   . AAA (abdominal aortic aneurysm)   . Gout   . Atrial fibrillation      PSH: Past Surgical History  Procedure Laterality Date  . Coronary artery bypass graft    . Cardiac valve replacement    . Abdominal aortic aneurysm repair  02/14   I have reviewed the FH and SH and  If appropriate update it with new information. No Known Allergies Scheduled Meds: . antiseptic oral rinse  15 mL Mouth Rinse q12n4p  . chlorhexidine  15 mL Mouth Rinse BID  . metoprolol  5 mg Intravenous Q6H  . piperacillin-tazobactam (ZOSYN)  IV  3.375 g Intravenous Q8H  . vancomycin  1,000 mg Intravenous Q24H   Continuous Infusions: . dextrose 5 % and 0.45% NaCl 100 mL/hr at 12/11/12 0514   PRN Meds:.acetaminophen, hydrALAZINE    BP 126/81  Pulse 61  Temp(Src) 99.6 F (37.6 C) (Oral)  Resp 32  Ht 6' (1.829 m)  Wt 89.3 kg (196 lb 13.9 oz)  BMI 26.69 kg/m2  SpO2 97%   PPS:10-20%   12/08/12 Albumin 7/26   Intake/Output Summary (Last 24 hours) at 12/11/12 1232 Last data filed at 12/11/12 1200  Gross per 24 hour  Intake   2300 ml  Output    500 ml  Net   1800 ml   LBM: 7/29                Physical Exam:  General: Minimally responsive, eyes partially open, lethargic HEENT: buccal mucosa dry Chest: Coarse rhonchi upper airway, diminished at bases CVS:RRR Abdomen:soft, BS hypoactive Ext: no pedal edema Neuro: sluggish, verbal responses minimal and muffled  Labs: CBC    Component Value Date/Time   WBC 8.8 12/09/2012 0440   RBC 4.36 12/09/2012 0440   HGB 11.6* 12/09/2012 0440   HCT 36.5* 12/09/2012 0440   PLT 130* 12/09/2012 0440   MCV 83.7 12/09/2012 0440   MCH 26.6 12/09/2012 0440   MCHC 31.8 12/09/2012 0440   RDW 18.8* 12/09/2012 0440   LYMPHSABS 1.2 11/30/2012 1535   MONOABS 0.5 11/30/2012 1535    EOSABS 0.0 11/30/2012 1535   BASOSABS 0.0 11/30/2012 1535    BMET    Component Value Date/Time   NA 149* 12/11/2012 0405   K 3.3* 12/11/2012 0405   CL 117* 12/11/2012 0405   CO2 22 12/11/2012 0405   GLUCOSE 149* 12/11/2012 0405   BUN 44* 12/11/2012 0405   CREATININE 1.76* 12/11/2012 0405   CALCIUM 8.5 12/11/2012 0405   GFRNONAA 34* 12/11/2012 0405   GFRAA 39* 12/11/2012 0405    CMP     Component Value Date/Time   NA 149* 12/11/2012 0405   K 3.3* 12/11/2012 0405   CL 117* 12/11/2012 0405   CO2 22 12/11/2012 0405   GLUCOSE 149* 12/11/2012 0405   BUN 44* 12/11/2012 0405   CREATININE 1.76* 12/11/2012 0405   CALCIUM 8.5 12/11/2012 0405   PROT 6.2 12/08/2012 0500   ALBUMIN 2.8* 12/08/2012 0500   AST 18 12/08/2012 0500   ALT 12 12/08/2012 0500   ALKPHOS 42 12/08/2012 0500   BILITOT 1.2 12/08/2012 0500   GFRNONAA  34* 12/11/2012 0405   GFRAA 39* 12/11/2012 0405    Chest Xray Reviewed/Impressions:12/08/12 IMPRESSION:  Stable marked cardiomegaly without pulmonary edema. Improved  aeration in the lung bases with mild left basilar atelectasis  persisting. No new abnormalities.    CT scan of the Head Reviewed/Impressions:12/08/12 IMPRESSION:  1. Slight decrease in the volume of intraventricular hemorrhage,  centered at the posterior aspect of the left lateral ventricle.  2. No evidence of hydrocephalus.  3. Cerebral and cerebellar atrophy with chronic microvascular  ischemic changes in the cerebral white matter redemonstrated.    Time In Time Out Total Time Spent with Patient Total Overall Time  11:30a 1:30p 25 min 120 min    Greater than 50%  of this time was spent counseling and coordinating care related to the above assessment and plan.  Freddie Breech, CNS-C Palliative Medicine Team [facility] Team Phone: [phone] Pager: [phone]

## 2012-12-11 NOTE — Progress Notes (Signed)
Patient [name]      DOB: 08-03-1927      BJY:[id number]  Palliative care meeting with family today-decision made to transition patient to full comfort, requesting to be offered residential hospice choice. Family agreeable for comfort feedings with known risk of aspiration. Dr Butler Denmark aware, transfer orders to medical unit orders placed. Henrietta Mayo CM offered choice to family, will relay choice to SW.   Full Palliative Care note to follow.   Bryan Lam, CNS-C Palliative Medicine Team [facility] Team Phone: [phone] Pager: [phone]

## 2012-12-11 NOTE — Progress Notes (Signed)
ANTIBIOTIC CONSULT NOTE - FOLLOW UP  Pharmacy Consult for Vancomycin + Zosyn Indication: Empiric aspiration PNA coverage  No Known Allergies  Patient Measurements: Height: 6' (182.9 cm) Weight: 196 lb 13.9 oz (89.3 kg) IBW/kg (Calculated) : 77.6  Vital Signs: Temp: 99.3 F (37.4 C) (07/29 0817) Temp src: Oral (07/29 0817) BP: 124/83 mmHg (07/29 0817) Pulse Rate: 62 (07/29 0817) Intake/Output from previous day: 07/28 0701 - 07/29 0700 In: 2300 [I.V.:2100; IV Piggyback:200] Out: 350 [Urine:350] Intake/Output from this shift:    Labs:  Recent Labs  12/09/12 0440 12/10/12 0540 12/11/12 0405  WBC 8.8  --   --   HGB 11.6*  --   --   PLT 130*  --   --   CREATININE 1.22 1.14 1.76*   Estimated Creatinine Clearance: 34.9 ml/min (by C-G formula based on Cr of 1.76). No results found for this basename: VANCOTROUGH, Leodis Binet, VANCORANDOM, GENTTROUGH, GENTPEAK, GENTRANDOM, TOBRATROUGH, TOBRAPEAK, TOBRARND, AMIKACINPEAK, AMIKACINTROU, AMIKACIN,  in the last 72 hours   Microbiology: Recent Results (from the past 720 hour(s))  MRSA PCR SCREENING     Status: Abnormal   Collection Time    11/30/12  8:50 PM      Result Value Range Status   MRSA by PCR POSITIVE (*) NEGATIVE Final   Comment:            The GeneXpert MRSA Assay (FDA     approved for NASAL specimens     only), is one component of a     comprehensive MRSA colonization     surveillance program. It is not     intended to diagnose MRSA     infection nor to guide or     monitor treatment for     MRSA infections.     RESULT CALLED TO, READ BACK BY AND VERIFIED WITH:     SHELTON,M RN [id number] AT 2245 SKEEN,P  CULTURE, BLOOD (ROUTINE X 2)     Status: None   Collection Time    12/04/12  7:18 PM      Result Value Range Status   Specimen Description BLOOD ARM RIGHT   Final   Special Requests BOTTLES DRAWN AEROBIC AND ANAEROBIC 10CC   Final   Culture  Setup Time 12/05/2012 04:12   Final   Culture NO GROWTH 5 DAYS   Final    Report Status 12/11/2012 FINAL   Final  CULTURE, BLOOD (ROUTINE X 2)     Status: None   Collection Time    12/04/12  7:25 PM      Result Value Range Status   Specimen Description BLOOD HAND RIGHT   Final   Special Requests BOTTLES DRAWN AEROBIC ONLY 10CC   Final   Culture  Setup Time 12/05/2012 04:12   Final   Culture NO GROWTH 5 DAYS   Final   Report Status 12/11/2012 FINAL   Final  URINE CULTURE     Status: None   Collection Time    12/06/12  9:07 AM      Result Value Range Status   Specimen Description URINE, CATHETERIZED   Final   Special Requests none Normal   Final   Culture  Setup Time 12/06/2012 09:41   Final   Colony Count NO GROWTH   Final   Culture NO GROWTH   Final   Report Status 12/07/2012 FINAL   Final    Anti-infectives   Start     Dose/Rate Route Frequency Ordered Stop   12/08/12 0600  vancomycin (VANCOCIN) IVPB 750 mg/150 ml premix     750 mg 150 mL/hr over 60 Minutes Intravenous Every 12 hours 12/07/12 1712     12/07/12 1715  piperacillin-tazobactam (ZOSYN) IVPB 3.375 g     3.375 g 12.5 mL/hr over 240 Minutes Intravenous 3 times per day 12/07/12 1700     12/07/12 1715  vancomycin (VANCOCIN) IVPB 1000 mg/200 mL premix     1,000 mg 200 mL/hr over 60 Minutes Intravenous NOW 12/07/12 1712 12/07/12 1851   12/06/12 1530  azithromycin (ZITHROMAX) 500 mg in dextrose 5 % 250 mL IVPB  Status:  Discontinued     500 mg 250 mL/hr over 60 Minutes Intravenous Every 24 hours 12/06/12 1519 12/07/12 1700      Assessment: 79 y.o. M on Vancomycin and Zosyn for empiric aspiration PNA coverage. Today is D#5 of treatment -- per Dr. Vassie Moment note, planned duration of treatment is 5 days -- which should be sufficient for aspiration PNA.   The patient's renal function has declined today requiring a Vancomycin dose adjustment. SCr 1.76 << 1.14, CrCl~30 ml/min, UOP 0.3 ml/kg/hr  Goal of Therapy:  Vancomycin trough level 15-20 mcg/ml  Plan:  1. Reduce Vancomycin to 1000 mg  IV every 24 hours 2. Continue Zosyn 3.375g IV every 8 hours 3. Consider stopping antibiotics today since planned duration specified was 5 days.  4. Will continue to follow renal function, culture results, LOT, and antibiotic de-escalation plans   Georgina Pillion, PharmD, BCPS Clinical Pharmacist Pager: [phone] 12/11/2012 8:45 AM

## 2012-12-11 NOTE — Progress Notes (Signed)
Rehab admissions - Noted plans for comfort care and residential hospice.  I will sign off for acute inpatient rehab.  #[phone]

## 2012-12-11 NOTE — Progress Notes (Signed)
Speech Language Pathology Dysphagia Treatment Patient Details Name: Bryan Lam MRN: [id number] DOB: 08-02-1927 Today's Date: 12/11/2012 Time: 6578-4696 SLP Time Calculation (min): 47 min  Assessment / Plan / Recommendation Clinical Impression  Reassessed potential for MBS.  Pt. is reluctant to take po's and does not f/c's well.  Continues to exhibit a significantly delayed swallow initiation, up to 20 seconds prior to swallowing purees, and 8 seconds before initiating a swallow with nectar thick liquids.  A delayed and very congested cough was observed well after swallow of puree, and applesauce was suctioned from oropharynx (likely residue in the vallecule).  Respirations became more rapid and labored, with use of accesory muscles noted, and RR of 35-41.  Pt. then began heaving as if going to vomit.  This subsided after about one minute.  RR returned to 30.  Sats remained around 97% throughout this session.  Family educated to dysphagia symptoms.  Plan is for meeting with Palliative Care team.      Diet Recommendation       SLP Plan Continue with current plan of care;Consult other service (comment) (Palliative Care meeting pending)   Pertinent Vitals/Pain n/a   Swallowing Goals     General Temperature Spikes Noted: Yes (Low grade fevers; on antibiotics for possible pneumonitis) Respiratory Status: Supplemental O2 delivered via (comment) Behavior/Cognition: Alert;Confused;Distractible;Requires cueing;Doesn't follow directions;Decreased sustained attention Oral Cavity - Dentition: Adequate natural dentition Patient Positioning: Upright in bed  Oral Cavity - Oral Hygiene Does patient have any of the following "at risk" factors?: Oxygen therapy - cannula, mask, simple oxygen devices Brush patient's teeth BID with toothbrush (using toothpaste with fluoride): Yes Patient is HIGH RISK - Oral Care Protocol followed (see row info): Yes Patient is AT RISK - Oral Care Protocol followed (see row  info): Yes Patient is mechanically ventilated, follow VAP prevention protocol for oral care: Oral care provided every 4 hours   Dysphagia Treatment Treatment focused on: Skilled observation of diet tolerance;Upgraded PO texture trials;Patient/family/caregiver education;Facilitation of oral preparatory phase;Facilitation of pharyngeal phase Family/Caregiver Educated: Wife and Daughter Treatment Methods/Modalities: Skilled observation;Differential diagnosis Patient observed directly with PO's: Yes Type of PO's observed: Dysphagia 1 (puree) Feeding: Total assist Liquids provided via: Teaspoon Oral Phase Signs & Symptoms: Prolonged oral phase Pharyngeal Phase Signs & Symptoms: Suspected delayed swallow initiation;Delayed cough;Changes in respirations Type of cueing: Verbal;Tactile Amount of cueing: Maximal   GO     [name] 12/11/2012, 10:32 AM

## 2012-12-12 DIAGNOSIS — T17908A Unspecified foreign body in respiratory tract, part unspecified causing other injury, initial encounter: Secondary | ICD-10-CM

## 2012-12-12 MED ORDER — MORPHINE SULFATE (CONCENTRATE) 10 MG /0.5 ML PO SOLN
5.0000 mg | ORAL | Status: DC | PRN
  Administered 2012-12-12: 5 mg via SUBLINGUAL

## 2012-12-12 MED ORDER — ATROPINE SULFATE 1 % OP SOLN: 2.0000 [drp] | OPHTHALMIC | Status: AC | PRN

## 2012-12-12 MED ORDER — MORPHINE SULFATE (CONCENTRATE) 10 MG /0.5 ML PO SOLN: 5.0000 mg | ORAL | Status: AC

## 2012-12-12 MED ORDER — ACETAMINOPHEN 650 MG RE SUPP: 650.0000 mg | Freq: Four times a day (QID) | RECTAL | Status: AC | PRN

## 2012-12-12 MED ORDER — MORPHINE SULFATE (CONCENTRATE) 10 MG /0.5 ML PO SOLN
5.0000 mg | ORAL | Status: DC
  Administered 2012-12-12: 5 mg via SUBLINGUAL
  Filled 2012-12-12 (×2): qty 0.5

## 2012-12-12 MED ORDER — LORAZEPAM 2 MG/ML IJ SOLN: INTRAMUSCULAR | Status: AC

## 2012-12-12 NOTE — Progress Notes (Signed)
Patient Bryan Lam      DOB: Oct 25, 1927      BMW:[id number]   Palliative Medicine Team at [facility] Progress Note    Subjective:Patient minimally responsive, eyes partially open, respirations labored at 40/min, accessory muscle use noted. Family at bedside.Discussed medications to be used for symptom management.  Filed Vitals:   12/12/12 0547  BP: 162/93  Pulse: 44  Temp: 97.5 F (36.4 C)  Resp: 20   Physical exam: General: Labored breathing HEENT: buccal mucosa dry CHEST: coarse Rhonchi, diminished at bases CVS: bradycardic ABD: hypoactive NEURO: non-responsive  Assessment and plan: 79 yo WM, with PMH: A-fib, aortic valve replacement, CAD (s/p CABG), abdominal aneurysm repair. Taken to [facility] on 7/18 from home with weakness and confusion, CT scan revealed IV bleed, transferred to [facility]. Neuro evaluated no surgery recommended. Since patient has not been able to take in oral food or fluids, SLP evaluation indicates severe risk for aspiration.   Patient with labored breathing today, minimally responsive to verbal stimuli  Code Status: DNR/DNI  1. Anxiety/Agitation: Ativan IV 1 mg every 4 hours as needed 2. Pain/Dyspnea: will schedule Roxanol 5 mg every 4 hours with every 2 hour breakthrough dosing available-Education to staff on use of medication for comfort 3. Bowel Regimen: Dulcolax suppository daily as needed 4. Fever: Tylenol suppository 650 mg every 6 hours as neeeded 5. Terminal Secretions: Atropine drops 2 SL every 4 hours as needed 6. Dysphagia: comfort feeding with known risk of aspiration-suction as needed-oral mouth care   Time In Time Out Total Time Spent with Patient Total Overall Time  10:00a 10:30a 30 min 30 min    Freddie Breech, CNS-C Palliative Medicine Team [facility] Team Phone: [phone] Pager: [phone]

## 2012-12-12 NOTE — Progress Notes (Signed)
CSW contacted [facility]- spoke to Denise. She stated that they have received the referral and it is currently being reviewed.  Sandra with residential hospice home will call CSW back asap. CSW will discuss with family.  Makisha Marrin T. Tiwana Chavis, LCSWA  [phone]    

## 2012-12-12 NOTE — Progress Notes (Signed)
CSW received call from [facility] that Palliative Care is recommending residential hospice home.  RNCM provided list of residential hospice homes to family and they are requesting [facility].  CSW contacted Angelique Blonder at [facility] and sent requested referral information. Awaiting call back re: possible bed. Lorri Frederick. [place]  [phone]

## 2012-12-12 NOTE — Clinical Social Work Psychosocial (Signed)
     Clinical Social Work Department BRIEF PSYCHOSOCIAL ASSESSMENT 12/12/2012  Patient:  Bryan Lam, Bryan Lam     Account Number:  [id number]     Admit date:  11/30/2012  Clinical Social Worker:  Tiburcio Pea  Date/Time:  12/12/2012 10:00 AM  Referred by:  Physician  Date Referred:  12/12/2012 Referred for  Residential hospice placement   Other Referral:   Interview type:  Family Other interview type:   Wife, son and daughter    PSYCHOSOCIAL DATA Living Status:  WIFE Admitted from facility:   Level of care:   Primary support name:  Bryan Lam  (c) [phone] Primary support relationship to patient:  CHILD, ADULT Degree of support available:   Strong support  Daughter:  Bryan Lam  (c) [phone]    CURRENT CONCERNS Current Concerns  Post-Acute Placement   Other Concerns:   Needs residential hospice    SOCIAL WORK ASSESSMENT / PLAN 79 year old male admitted from home where he lives with his wife.  Patient and family met with Palliative care team for goals of care on 12/11/12 and it was recommended that patient be referred for residential hospice. Family was given residential choices and wanted [facility].  Referral completed to [facility] on 12/11/12. Bed offer recieved today and accepted by family. Ok per MD for d/c to facility today. Notified pt's nurse- Aurea Graff of above- she will call report. EMS arranged.   Assessment/plan status:  No Further Intervention Required Other assessment/ plan:   Information/referral to community resources:   Residential Hospice Home list provided to family.    PATIENTS/FAMILYS RESPONSE TO PLAN OF CARE: CSW spoke with patient's wife, daughter Bryan Lam and son Bryan Lam.  They are very pleased with d/c plan to [facility] as they live near the facility.  Patient was lying in bed but did not open his eyes or respond to CSW. He did not appear to be in any distress.  No further CSW needs identified.  CSW signing off.  Lorri Frederick.  Dai Apel, LCSWA  [phone]

## 2012-12-12 NOTE — Discharge Summary (Signed)
Physician Discharge Summary  Bryan Lam ZOX:[id number] DOB: 02-Sep-1927 DOA: 11/30/2012  PCP: No primary provider on file.  Admit date: 11/30/2012 Discharge date: 12/12/2012  Time spent: >30 minutes  Discharge Diagnoses:  Principal Problem:   Subarachnoid hemorrhage Active Problems:   Acute respiratory failure   CVA (cerebral infarction)   Malignant hypertension   AF (atrial fibrillation)   Acute encephalopathy   Dyspnea   Increased oropharyngeal secretions   Pain   Palliative care encounter   Discharge Condition: Stable and overall comfortable. Will transferred to [facility] for further comfort care.  Diet recommendation: dysphagia 1 diet; comfort feeding  Filed Weights   12/09/12 0403 12/10/12 0343 12/11/12 0500  Weight: 88 kg (194 lb 0.1 oz) 88.8 kg (195 lb 12.3 oz) 89.3 kg (196 lb 13.9 oz)    History of present illness:  79 year old male who lives at home and has a history of atrial fibrillation on coumadin, aortic valve replacement with a porcine valve, CAD status post CABG, abdominal aortic aneurysm repair.  The patient presented to the hospital on 7/18 with weakness and confusion. He was initially taken to [facility] where a CT scan of the brain revealed interventricular blood. He was transferred to [facility] and admitted under the ICU service. A Neurosurgical consult was requested and no surgical intervention was recommended. He was also noted to have malignant hypertension and it is suspected that the intracranial bleed was secondary to uncontrolled blood pressure in the setting of Coumadin use.  Hospital Course:  Left Intraventricular Intracranial Hemorrhage  -pt has not experienced any significant improvement in function or communication over the last 24hrs  -after meeting with palliative care decision is for full comfort at residential hospice. -will discharge to [facility] -continue ativan and morphine for comfort -Atropine for  increased EOL secretions  Acute encephalopathy  Interfering with ability to eat - EEG w/o evidence of Sz acitivity - awakens to repeated stimulus but still seems overall very lethargic and confused -family ok with Dysphagia 1 diet and known risk of further aspiration (comfort feeding  Fevers  started on 7/22 with TM 102 - 99.6 TM (7/28) - family noted cough and phlegm in mouth but CXR negative for pneumonia, and negative again on repeat 7/26 - fevers may be neurogenic - given trouble w/ secretions, aspiration pneumonitis considered likely - empiric coverage w/ zosyn and vanc (MRSA + on screen) for 5 days of tx given; now patient full comfort care and no more antibiotics will be offered.  Malignant hypertension  -BP controlled initially with nicardipine  -now unable to take PO's -plan is for full comfort care  Aortic valve replacement with a porcine valve  -uanble to safely anticoagulate at this time - see discussion above   AF (atrial fibrillation) with RVR  -rate controlled at this time  -Anticoagulation on hold due to ICH   CAD status post CABG  Aspirin on hold   *Rest of medical problems remains stable and at this point the plan is for full comfort care. Will transfer to [facility].   SIGNIFICANT EVENTS / STUDIES:  7/18 CT Head>>>large amt of intraventricular blood on L, trace on R  7/21 Weaned off nicardipine infusion  7/21 CT head: Slight decrease in size of left ventricular hemorrhage with less dilatation of the temporal horn of the left lateral ventricle. No new intracranial hemorrhage or infarct identified. No midline shift.  7/22 More lethargic. SBP goal changed to 120 - 180 mmHg per Stroke Team  7/23 CT head: [facility]  7/23 NS consult: no indication for ventric drain @ present  7/28 - failed swallow eval - NG unable to be placed on multiple prior attempts  Consultations:  PC  Neurology  Neurosurgery  Palliative care  Discharge Exam: Filed Vitals:   12/11/12  1700 12/11/12 1800 12/11/12 2025 12/12/12 0547  BP: 153/82 171/77 177/92 162/93  Pulse:  68 36 44  Temp: 98.2 F (36.8 C)  97.5 F (36.4 C) 97.5 F (36.4 C)  TempSrc: Oral  Tympanic Axillary  Resp: 30 30 22 20   Height:      Weight:      SpO2: 98% 99% 100% 98%    General:overall comfortable; but with mild intermittent tachypnea and increased EOL secretions. Afebrile Cardiovascular: rate controlled, no rubs Respiratory: diffuse rhonchi, no accessory muscle use; mild tachypnea Abdomen: soft, NT, ND, positive BS  Discharge Instructions  Discharge Orders   Future Orders Complete By Expires     Discharge instructions  As directed     Comments:      Full comfort care.Marland Kitchen Dysphagia 1 diet (comfort feeding)        Medication List    STOP taking these medications       allopurinol 300 MG tablet  Commonly known as:  ZYLOPRIM     aspirin EC 81 MG tablet     FISH OIL PO     metoprolol succinate 25 MG 24 hr tablet  Commonly known as:  TOPROL-XL     ramipril 10 MG capsule  Commonly known as:  ALTACE     warfarin 1 MG tablet  Commonly known as:  COUMADIN      TAKE these medications       acetaminophen 650 MG suppository  Commonly known as:  TYLENOL  Place 1 suppository (650 mg total) rectally every 6 (six) hours as needed for fever (prn temp > 101).     atropine 1 % ophthalmic solution  Place 2 drops under the tongue every 4 (four) hours as needed (for terminal secretions).     LORazepam 2 MG/ML injection  Commonly known as:  ATIVAN  2 mg sublingual every 3 hours as needed for end of life agitation and comfort     morphine CONCENTRATE 10 mg / 0.5 ml concentrated solution  Place 0.25 mLs (5 mg total) under the tongue every 4 (four) hours.       No Known Allergies    The results of significant diagnostics from this hospitalization (including imaging, microbiology, ancillary and laboratory) are listed below for reference.    Significant Diagnostic Studies: Ct  Head Wo Contrast  12/08/2012   *RADIOLOGY REPORT*  Clinical Data: Intraventricular hemorrhage.  Increasing lethargy.  CT HEAD WITHOUT CONTRAST  Technique:  Contiguous axial images were obtained from the base of the skull through the vertex without contrast.  Comparison: 12/05/2012.  Findings: Again noted is a large amount of high attenuation material within the posterior aspect of the left lateral ventricle, compatible with intraventricular hemorrhage.  This measures up to approximately 4.7 x 3.1 cm on today's examination and appears slightly decreased compared to the prior study.  Some dependent hemorrhage is also noted posteriorly within the lateral ventricles bilaterally.  No definite hemorrhage within the third ventricle or fourth ventricle.  No evidence of obstructive hydrocephalous at this time.  Mild cerebral and cerebellar atrophy is again noted. Patchy areas of decreased attenuation throughout the deep and periventricular white matter of the cerebral hemispheres bilaterally is compatible  with mild chronic microvascular ischemic disease, similar to the prior study.  No acute displaced skull fractures are identified.  Visualized paranasal sinuses and mastoids are well pneumatized.  IMPRESSION: 1.  Slight decrease in the volume of intraventricular hemorrhage, centered at the posterior aspect of the left lateral ventricle. 2.  No evidence of hydrocephalus. 3.  Cerebral and cerebellar atrophy with chronic microvascular ischemic changes in the cerebral white matter redemonstrated.   Original Report Authenticated By: Trudie Reed, M.D.   Ct Head Wo Contrast  12/05/2012   *RADIOLOGY REPORT*  Clinical Data: Follow-up intracranial hemorrhage.  CT HEAD WITHOUT CONTRAST  Technique:  Contiguous axial images were obtained from the base of the skull through the vertex without contrast.  Comparison: 12/03/2012  Findings: Acute intraventricular hemorrhage in the left lateral ventricle with less prominent hemorrhage  in the right lateral ventricle.  Left lateral ventricular hemorrhage measures about 5.9 x 2.8 cm diameter, similar to previous study.  Mild ventricular dilatation without change.  No new hemorrhage identified.  Low attenuation change in the left periventricular white matter consistent with edema is stable.  No abnormal extra-axial fluid collections.  Basal cisterns are not effaced.  Gray-white matter junctions are distinct.  No depressed skull fractures.  Visualized paranasal sinuses and mastoid air cells are not opacified.  IMPRESSION: Stable appearance of large left and small right intraventricular hemorrhage.  Stable low attenuation edema in the left periventricular white matter.  No significant change since previous study.   Original Report Authenticated By: Burman Nieves, M.D.   Ct Head Wo Contrast  12/03/2012   *RADIOLOGY REPORT*  Clinical Data: Intraventricular hemorrhage, decreased mental status  CT HEAD WITHOUT CONTRAST  Technique:  Contiguous axial images were obtained from the base of the skull through the vertex without contrast.  Comparison: Prior CT from 12/01/2012  Findings: Previously identified intraventricular hemorrhage centered in the left lateral ventricle is slightly decreased in size now measuring 6.1 x 2.5 cm. There is no significant midline shift.  The left temporal horn of the left lateral ventricle is less dilated as compared to the prior exam.  Blood within the right lateral ventricle is similar.  No increase in hydrocephalus.  No new hemorrhage or infarct identified. Edema within the left lateral ventricle is similar. Dolichoectasia of the intracranial circulation is again noted.  Calvarium and soft tissues are unremarkable.  Paranasal sinuses and mastoid air cells are clear.  IMPRESSION: Slight interval decrease in size of left ventricular hemorrhage with less dilatation of the temporal horn of the left lateral ventricle.  No new intracranial hemorrhage or infarct identified. No  midline shift.   Original Report Authenticated By: Rise Mu, M.D.   Ct Head Wo Contrast  12/01/2012   *RADIOLOGY REPORT*  Clinical Data: 79 year old male with intracranial hemorrhage.  CT HEAD WITHOUT CONTRAST  Technique:  Contiguous axial images were obtained from the base of the skull through the vertex without contrast.  Comparison: 12/01/2012 and earlier.  Findings: Study is mildly degraded by motion artifact despite repeated imaging attempts.  Stable visualized osseous structures.  Stable paranasal sinuses and mastoids.  Stable orbit and scalp soft tissues.  Confluent left lateral intraventricular hemorrhage, volume not significantly changed (up to 69 x 26 mm transaxially).  Extension of hemorrhage into the other ventricles likewise not significantly changed.  As before, evidence of some trapping of the left lateral ventricle, with enlargement of the left temporal horn.  No midline shift.  Left hemisphere cerebral edema not significantly changed.  No superimposed  acute cortically based infarct.  Stable gray-white matter differentiation elsewhere.  Basilar cisterns remain patent. Stable appearance of the intracranial vascular structures with a degree of dolichoectasia.  IMPRESSION:  Stable.  Intraventricular hemorrhage centered at the left lateral ventricle, with some degree of trapping of that ventricle, and left hemisphere edema.  No new intracranial abnormality.   Original Report Authenticated By: Erskine Speed, M.D.   Ct Head Wo Contrast  12/01/2012   *RADIOLOGY REPORT*  Clinical Data: Evaluate size of hemorrhage  CT HEAD WITHOUT CONTRAST  Technique:  Contiguous axial images were obtained from the base of the skull through the vertex without contrast.  Comparison: Prior CT from 11/30/2012  Findings: Previously identified intraventricular hemorrhage centered primarily within the atria of the left lateral ventricle is again seen, not significantly changed in size as compared to the prior  examination.  Minimal left to right midline shift of approximately 3 mm at the level hemorrhage is unchanged. Prominence of the lateral ventricles is similar to slightly increased as compared to the prior examination.  Dilatation of the temporal horn on the left is again seen, also similar.  No new hemorrhages identified.  Small amount of hemorrhage within the right and left ventricles dependently is slightly more prominent as compared to the prior exam.  There is no extra-axial fluid collection. Diffuse atrophy is again noted.  No new acute intracranial infarct is identified. Hypoattenuation within the periventricular white matter consistent with chronic small vessel ischemic changes is again noted.  The paranasal sinuses and mastoid air cells are clear.  Calvarium is intact.  IMPRESSION: Stable size and appearance of predominately left-sided intraventricular hemorrhage with stable to slightly increased prominence of the lateral ventricles as compared to the prior study.   Original Report Authenticated By: Rise Mu, M.D.   Ct Head Wo Contrast  11/30/2012   *RADIOLOGY REPORT*  Clinical Data: Head pain, left eye droop, left arm weakness  CT HEAD WITHOUT CONTRAST  Technique:  Contiguous axial images were obtained from the base of the skull through the vertex without contrast.  Comparison: None.  Findings:  There is a large amount of intraventricular blood centered primarily within the atria of the left lateral ventricle.  The hemorrhage appears to originate at the level of the choroid plexus. No definite intraparenchymal or subdural hemorrhage.  There is a minimal amount of blood extending to involve the third ventricle. There is minimal dilatation of the temporal horn of the left lateral ventricle (image 14) with associated minimal amount of left to right midline shift measuring approximately 4 mm.  A small amount of blood is also noted layering dependently within the occipital horn of the right lateral  ventricle (image 14)  Diffuse atrophy and sulcal prominence.  No definite acute large territory infarct.  Limited visualization of the paranasal sinuses and mastoid air cells are normal.  Post bilateral cataract surgery.  Regional soft tissues are normal.  No displaced calvarial fracture.  IMPRESSION:  1.  Large amount of interventricular blood centered primarily within the atria of the left lateral ventricle at the level of the choroid plexus. The etiology of this hemorrhage is indeterminate but could be the sequela of trauma, hypercoagulable state and/or choroid plexus vascular malformation. 2.  The amount of left-sided intraventricular blood results in minimal dilatation of the temporal horn of the left lateral ventricle and associated minimal (approximately 4 mm) of left to right midline shift.  3.  Trace amount of intraventricular blood is seen within the dependent portion  of the right lateral ventricle.  Above findings discussed with at Dr. Manus Gunning at [phone].   Original Report Authenticated By: Tacey Ruiz, MD   Dg Chest Port 1 View  12/08/2012   *RADIOLOGY REPORT*  Clinical Data: Follow up basilar atelectasis.  Possible aspiration.  PORTABLE CHEST - 1 VIEW 12/08/2012 0548 hours:  Comparison: Portable chest x-rays 12/06/2012 dating back to 11/30/2012.  Findings: Prior sternotomy CABG.  Stable marked cardiomegaly. Pulmonary vascularity normal without evidence of pulmonary edema. Improved aeration in the lung bases, with mild atelectasis persisting at the left base.  No new pulmonary parenchymal abnormalities.  IMPRESSION: Stable marked cardiomegaly without pulmonary edema.  Improved aeration in the lung bases with mild left basilar atelectasis persisting.  No new abnormalities.   Original Report Authenticated By: Hulan Saas, M.D.   Dg Chest Port 1 View  12/06/2012   *RADIOLOGY REPORT*  Clinical Data: Fevers and sputum  PORTABLE CHEST - 1 VIEW  Comparison: Chest radiograph 07/22 1014  Findings:  Sternotomy wires overlie stable enlarged heart silhouette. Aorta is ectatic aorta.  Low lung volumes.  Mild central venous congestion is unchanged.  IMPRESSION: No interval change.  Bibasilar atelectasis and cardiomegaly.   Original Report Authenticated By: Genevive Bi, M.D.   Dg Chest Port 1 View  12/04/2012   *RADIOLOGY REPORT*  Clinical Data: Fever.  Lethargy.  Coronary artery disease.  PORTABLE CHEST - 1 VIEW  Comparison: 12/03/2012  Findings: Moderate to severe cardiomegaly is unchanged.  Ectasia thoracic aorta is stable.  Prior CABG again noted.  Elevation of left hemidiaphragm is unchanged.  Both lungs remain clear.  IMPRESSION: Stable cardiomegaly.  No acute findings.   Original Report Authenticated By: Myles Rosenthal, M.D.   Dg Chest Port 1 View  12/03/2012   *RADIOLOGY REPORT*  Clinical Data: Respiratory failure.  PORTABLE CHEST - 1 VIEW  Comparison: Chest x-ray 12/01/2012.  Findings: Lung volumes are slightly low.  There is some bibasilar opacities favored to predominately reflect subsegmental atelectasis.  No definite acute consolidative airspace disease. Mild cephalization of the pulmonary vasculature without frank pulmonary edema.  No pleural effusions.  Heart size is moderately enlarged.  Mediastinal contours are distorted by low lung volumes and patient rotation.  Atherosclerosis in the thoracic aorta. Status post median sternotomy for CABG.  IMPRESSION: 1.  Moderate cardiomegaly with pulmonary venous congestion, but no frank pulmonary edema. 2.  Low lung volumes with minimal bibasilar subsegmental atelectasis. 3.  Atherosclerosis.   Original Report Authenticated By: Trudie Reed, M.D.   Dg Chest Port 1 View  12/01/2012   *RADIOLOGY REPORT*  Clinical Data: Evaluate for possible infiltrate  PORTABLE CHEST - 1 VIEW  Comparison: 11/30/2012  Findings: Significant cardiac silhouette enlargement stable.  The aorta is uncoiled.  Status post CABG.  Vascular pattern normal.  No infiltrate or  consolidation seen on the left.  Right lung is also clear except the costophrenic angle is excluded from the field of view.  IMPRESSION: No evidence of acute infiltrate.   Original Report Authenticated By: Esperanza Heir, M.D.   Dg Chest Portable 1 View  11/30/2012   *RADIOLOGY REPORT*  Clinical Data: Extremity weakness.  Confusion  PORTABLE CHEST - 1 VIEW  Comparison: None  Findings: There is been a median sternotomy and CABG procedure. There is moderate cardiac enlargement.  Small left pleural effusion identified.  There is decreased aeration the left base which may represent atelectasis or infiltrate.  Right lung appears clear.  IMPRESSION:  1.  Cardiac enlargement. 2.  Suspect left pleural effusion. 3.  Infiltrate versus atelectasis in the left base.   Original Report Authenticated By: Signa Kell, M.D.   Dg Abd Portable 1v  12/07/2012   *RADIOLOGY REPORT*  Clinical Data: Evaluate nasogastric tube placement.  PORTABLE ABDOMEN - 1 VIEW  Comparison: Chest films 07/24/ 2014  Findings: 2 supine views.  Aortic Endograft repair.  Nonobstructive bowel gas pattern.  Probable vascular calcification in the right hemi pelvis.  Nasogastric tube which terminates at the gastroesophageal junction. This may be looped on itself.  Patient rotated to the right on the second radiograph.  Small left pleural effusion.  Cardiomegaly.  IMPRESSION: Nasogastric terminating at the gastroesophageal junction, possibly looped on self.  Consider advancement   Original Report Authenticated By: Jeronimo Greaves, M.D.   Dg Naso G Tube Plc W/fl W/rad  12/08/2012   *RADIOLOGY REPORT*  Clinical Data: Feeding tube needed.  NASO G TUEB PLACEMENT WITH FL AND WITH RAD  Comparison:  No priors.  Fluoroscopy Time:  4 minutes and 44 seconds  Findings: Repeated attempts were made to place a metallic tipped of feeding tube.  Throughout each attempted placement, the patient was in significant respiratory distress.  Under fluoroscopic observation the  feeding tube was consistently going into the trachea rather than the esophagus.  No successful esophageal intubation was performed. The patient was unable to follow instructions throughout the procedure, and was unable to attempt to swallow the tube.  Significant bleeding from the patient's right nare was noted, and the patient appeared to be in significant respiratory distress.  The procedure was terminated.  IMPRESSION: 1.  Unsuccessful attempted placement of a feeding tube.   Original Report Authenticated By: Trudie Reed, M.D.    Microbiology: Recent Results (from the past 240 hour(s))  CULTURE, BLOOD (ROUTINE X 2)     Status: None   Collection Time    12/04/12  7:18 PM      Result Value Range Status   Specimen Description BLOOD ARM RIGHT   Final   Special Requests BOTTLES DRAWN AEROBIC AND ANAEROBIC 10CC   Final   Culture  Setup Time 12/05/2012 04:12   Final   Culture NO GROWTH 5 DAYS   Final   Report Status 12/11/2012 FINAL   Final  CULTURE, BLOOD (ROUTINE X 2)     Status: None   Collection Time    12/04/12  7:25 PM      Result Value Range Status   Specimen Description BLOOD HAND RIGHT   Final   Special Requests BOTTLES DRAWN AEROBIC ONLY 10CC   Final   Culture  Setup Time 12/05/2012 04:12   Final   Culture NO GROWTH 5 DAYS   Final   Report Status 12/11/2012 FINAL   Final  URINE CULTURE     Status: None   Collection Time    12/06/12  9:07 AM      Result Value Range Status   Specimen Description URINE, CATHETERIZED   Final   Special Requests none Normal   Final   Culture  Setup Time 12/06/2012 09:41   Final   Colony Count NO GROWTH   Final   Culture NO GROWTH   Final   Report Status 12/07/2012 FINAL   Final     Labs: Basic Metabolic Panel:  Recent Labs Lab 12/06/12 0530 12/08/12 0500 12/09/12 0440 12/10/12 0540 12/11/12 0405  NA 144 150* 154* 153* 149*  K 3.2* 3.3* 3.4* 3.2* 3.3*  CL 108 116* 121* 121* 117*  CO2 25 25 26 24 22   GLUCOSE 111* 144* 137* 132* 149*   BUN 51* 50* 47* 41* 44*  CREATININE 1.05 1.15 1.22 1.14 1.76*  CALCIUM 9.2 8.9 8.5 8.7 8.5  MG  --  2.3  --   --   --    Liver Function Tests:  Recent Labs Lab 12/08/12 0500  AST 18  ALT 12  ALKPHOS 42  BILITOT 1.2  PROT 6.2  ALBUMIN 2.8*    Recent Labs Lab 12/08/12 0500  AMMONIA 46   CBC:  Recent Labs Lab 12/06/12 0530 12/09/12 0440  WBC 10.7* 8.8  HGB 12.3* 11.6*  HCT 37.3* 36.5*  MCV 79.7 83.7  PLT 181 130*   CBG:  Recent Labs Lab 12/10/12 1627 12/10/12 2015 12/10/12 2352 12/11/12 0429 12/11/12 0755  GLUCAP 133* 137* 141* 135* 147*    Signed:  Kameran Mcneese  [facility] 12/12/2012, 11:35 AM

## 2012-12-12 NOTE — Progress Notes (Signed)
Stroke Team Progress Note  HISTORY 79 y.o. male with a past medical history significant for hypertension, CAD s/p CABG, s/p valve replacement, atrial fibrillation on coumadin, s/p AAA repair, transferred to [facility] from [facility] for further management of large intraventricular hemorrhage. Was very functional at home, and patient's wife called his son around 3 am today 11/30/2012 because he was feeling weak and was looking confused. Family report that then they noted that his left face was droopier than the right and the left arm was also weak. Family stated that he is getting more confused. Patient was taken to [facility] and had a CT brain that revealed a large Large amount of interventricular blood centered primarily within the atria of the left lateral ventricle at the level of the choroid plexus. There is minimal dilatation of the temporal horn of the left lateral ventricle and associated minimal (approximately 4 mm) of left to right midline shift. Trace amount of intraventricular blood is seen within the dependent portion of the right lateral ventricle. Family stated that he is getting more confused."  He was admitted to the neuro ICU for further evaluation and treatment.  SUBJECTIVE  Patient now at Palliative Care. Family at bedside.   OBJECTIVE Most recent Vital Signs: Filed Vitals:   12/11/12 1700 12/11/12 1800 12/11/12 2025 12/12/12 0547  BP: 153/82 171/77 177/92 162/93  Pulse:  68 36 44  Temp: 98.2 F (36.8 C)  97.5 F (36.4 C) 97.5 F (36.4 C)  TempSrc: Oral  Tympanic Axillary  Resp: 30 30 22 20   Height:      Weight:      SpO2: 98% 99% 100% 98%   CBG (last 3)   Recent Labs  12/10/12 2352 12/11/12 0429 12/11/12 0755  GLUCAP 141* 135* 147*    IV Fluid Intake:   . sodium chloride      MEDICATIONS  . antiseptic oral rinse  15 mL Mouth Rinse q12n4p  . chlorhexidine  15 mL Mouth Rinse BID  . morphine CONCENTRATE  5 mg Sublingual Q4H   PRN:  acetaminophen,  atropine, LORazepam, morphine CONCENTRATE  Diet:   Comfort feeds Activity:  Bedrest DVT Prophylaxis:    CLINICALLY SIGNIFICANT STUDIES Basic Metabolic Panel:   Recent Labs Lab 12/08/12 0500  12/10/12 0540 12/11/12 0405  NA 150*  < > 153* 149*  K 3.3*  < > 3.2* 3.3*  CL 116*  < > 121* 117*  CO2 25  < > 24 22  GLUCOSE 144*  < > 132* 149*  BUN 50*  < > 41* 44*  CREATININE 1.15  < > 1.14 1.76*  CALCIUM 8.9  < > 8.7 8.5  MG 2.3  --   --   --   < > = values in this interval not displayed. Liver Function Tests:   Recent Labs Lab 12/08/12 0500  AST 18  ALT 12  ALKPHOS 42  BILITOT 1.2  PROT 6.2  ALBUMIN 2.8*  d CBC:   Recent Labs Lab 12/06/12 0530 12/09/12 0440  WBC 10.7* 8.8  HGB 12.3* 11.6*  HCT 37.3* 36.5*  MCV 79.7 83.7  PLT 181 130*   Coagulation:  No results found for this basename: LABPROT, INR,  in the last 168 hours Cardiac Enzymes: No results found for this basename: CKTOTAL, CKMB, CKMBINDEX, TROPONINI,  in the last 168 hours Urinalysis:  No results found for this basename: COLORURINE, APPERANCEUR, LABSPEC, PHURINE, GLUCOSEU, HGBUR, BILIRUBINUR, KETONESUR, PROTEINUR, UROBILINOGEN, NITRITE, LEUKOCYTESUR,  in the last 168  hours Lipid Panel No results found for this basename: chol,  trig,  hdl,  cholhdl,  vldl,  ldlcalc   HgbA1C  No results found for this basename: HGBA1C   Urine Drug Screen:   No results found for this basename: labopia,  cocainscrnur,  labbenz,  amphetmu,  thcu,  labbarb    Alcohol Level: No results found for this basename: ETH,  in the last 168 hours   CT of the brain   12/04/2012 Stable appearance of large left and small right intraventricular hemorrhage. Stable low attenuation edema in the left periventricular white matter. No significant change since previous study. 12/03/2012 Slight interval decrease in size of left ventricular hemorrhage with less dilatation of the temporal horn of the left lateral  ventricle. No new  intracranial hemorrhage or infarct identified. No midline shift 12/01/2012    Stable.  Intraventricular hemorrhage centered at the left lateral ventricle, with some degree of trapping of that ventricle, and left hemisphere edema.  No new intracranial abnormality.     12/01/2012   Stable size and appearance of predominately left-sided intraventricular hemorrhage with stable to slightly increased prominence of the lateral ventricles as compared to the prior study.    11/30/2012     1.  Large amount of interventricular blood centered primarily within the atria of the left lateral ventricle at the level of the choroid plexus. The etiology of this hemorrhage is indeterminate but could be the sequela of trauma, hypercoagulable state and/or choroid plexus vascular malformation. 2.  The amount of left-sided intraventricular blood results in minimal dilatation of the temporal horn of the left lateral ventricle and associated minimal (approximately 4 mm) of left to right midline shift.  3.  Trace amount of intraventricular blood is seen within the dependent portion of the right lateral ventricle.       CXR   12/03/2012    1.  Moderate cardiomegaly with pulmonary venous congestion, but no frank pulmonary edema. 2.  Low lung volumes with minimal bibasilar subsegmental atelectasis. 3.  Atherosclerosis.  12/01/2012    No evidence of acute infiltrate.    11/30/2012   1.  Cardiac enlargement. 2.  Suspect left pleural effusion. 3.  Infiltrate versus atelectasis in the left base.    EEG  abnormal with mild generalized nonspecific slowing of cerebral activity, which can be seen with a wide variety of encephalopathic processes including degenerative as well as metabolic encephalopathy. No evidence of an epileptic disorder was recorded.  EKG     Therapy Recommendations CIR ---failed speech  GENERAL EXAM:  Patient is in no distress  CARDIOVASCULAR:  Regular rate and rhythm, no murmurs, no carotid bruits  NEUROLOGIC:   MENTAL STATUS:stuporose unresponsive and not following commands at presentCRANIAL NERVE: pupils (R 2.5, L 2, POST SURGICAL, MILD RXN), visual fields full to confrontation, extraocular muscles intact, no nystagmus, facial sensation and strength symmetric, uvula midline, shoulder shrug symmetric, tongue midline.  MOTOR: RUE 2, LUE 3, RLE 1, LLE 3.  SENSORY: normal and symmetric to light touch   ASSESSMENT [name] is a 79 y.o. male presenting with left sided  weakness and confusion. Imaging confirms a left lateral intraventricular hemorrhage. Hemorrhage felt to be secondary to combination of Hypertension and coumadin use.  On aspirin 81 mg orally every day and warfarin prior to admission. Now on no antithrombotics due to hemorrhage for secondary stroke prevention. Patient with resultant right field cut, right  hemiparesis. Work up completed.  Protein malnutrition, possible PEG. Family to  discuss. atrial fibrillation, on chronic coumadin prior to admission S/p valve replacement, on chronic coumadin prior to admission Hypertension S/p AAA repair CAD - s/p CABG No dementia at baseline Long term medication use  Hospital day # 12  TREATMENT/PLAN  Palliative Care has met with patients family and has transitioned the patient to full comfort and family wanting to proceed with progressing to residential hospice home in [place].  Stroke service will sign off.  Dr Pearlean Brownie did review head scans with family member to show progression of current state.  Gwendolyn Lima. Manson Passey, [facility], MBA, MHA [facility] Pager: [phone] 12/12/2012 11:07 AM  I have asked to reexamine this patient, reviewed chart and data and discuss with family at the bedside and answered questions  Delia Heady, MD

## 2012-12-12 NOTE — Progress Notes (Addendum)
Patient discharged to [facility] via [facility] , report given nurse Carmelia Roller.

## 2013-01-14 DEATH — deceased

## 2014-05-04 IMAGING — CR DG ABD PORTABLE 1V
2 series · 2 of 2 positions shown · non-contrast
Comparison: Chest films [DATE]/ [id number]

CLINICAL DATA: Evaluate nasogastric tube placement.

PORTABLE ABDOMEN - 1 VIEW

[AP (1 of 2)]
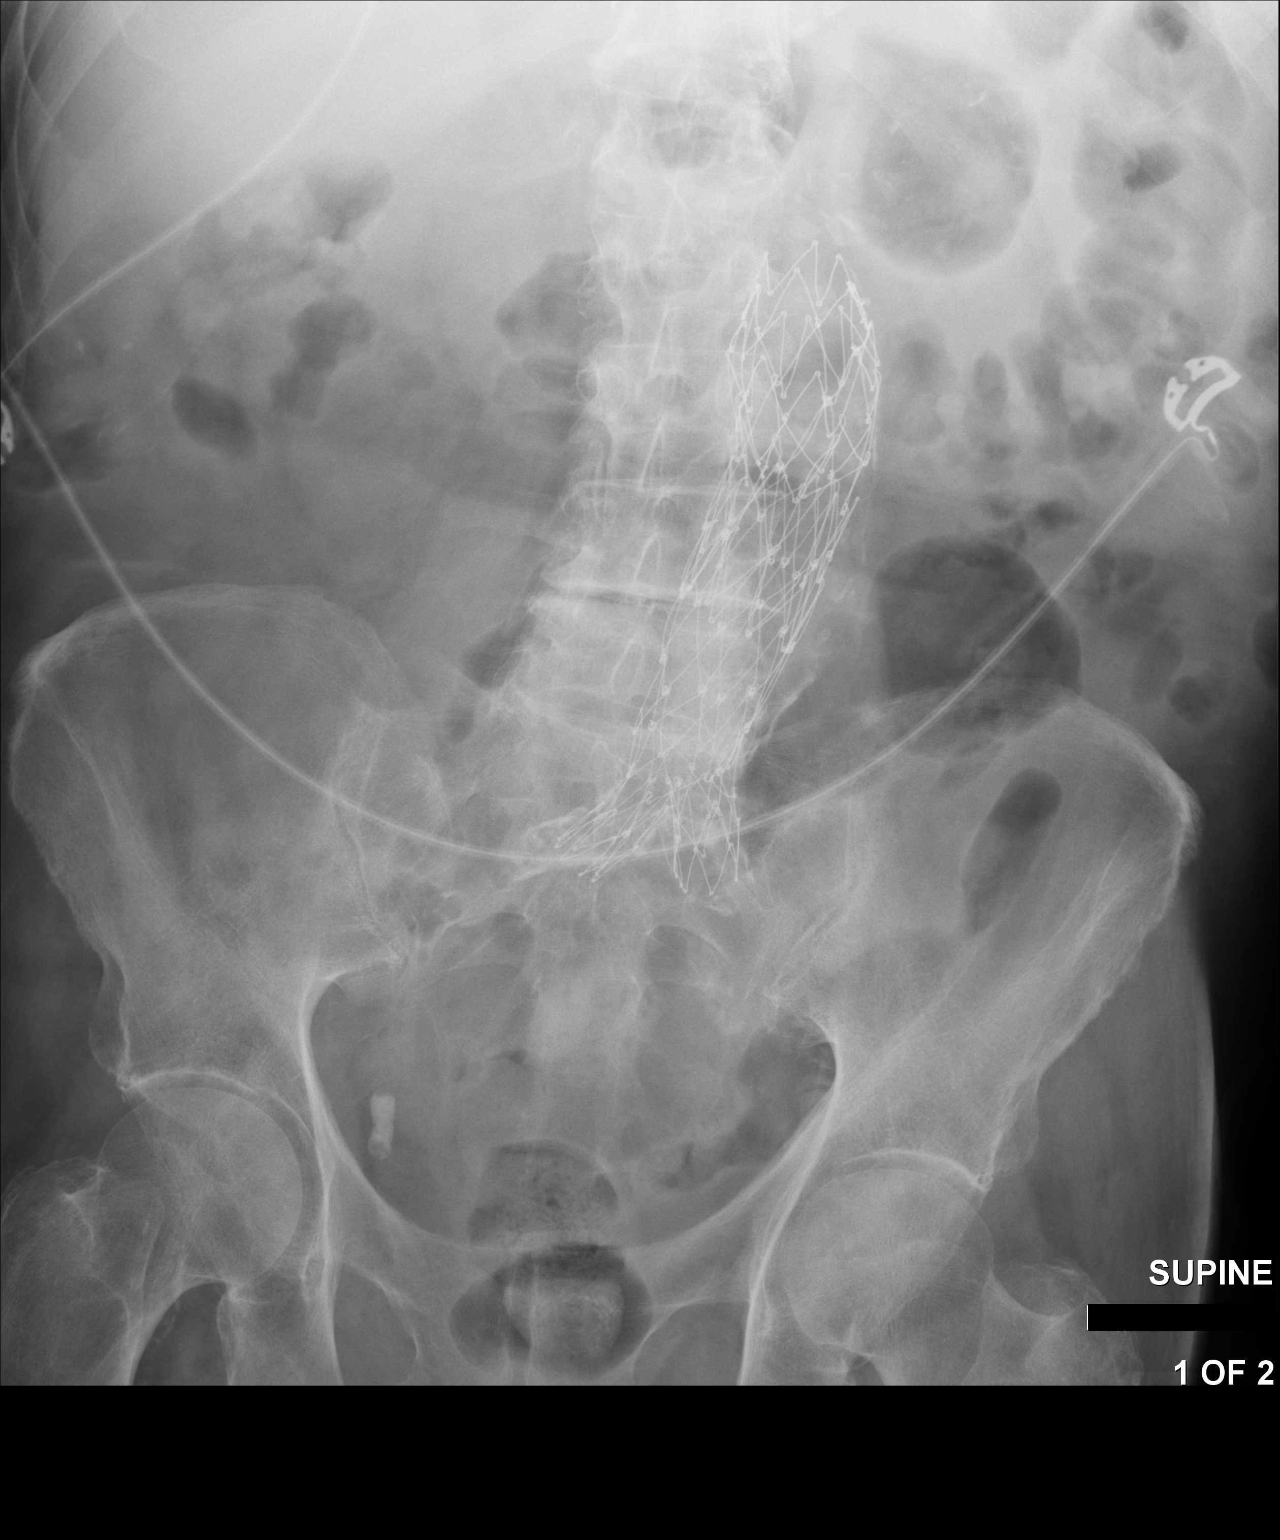

[AP (2 of 2)]
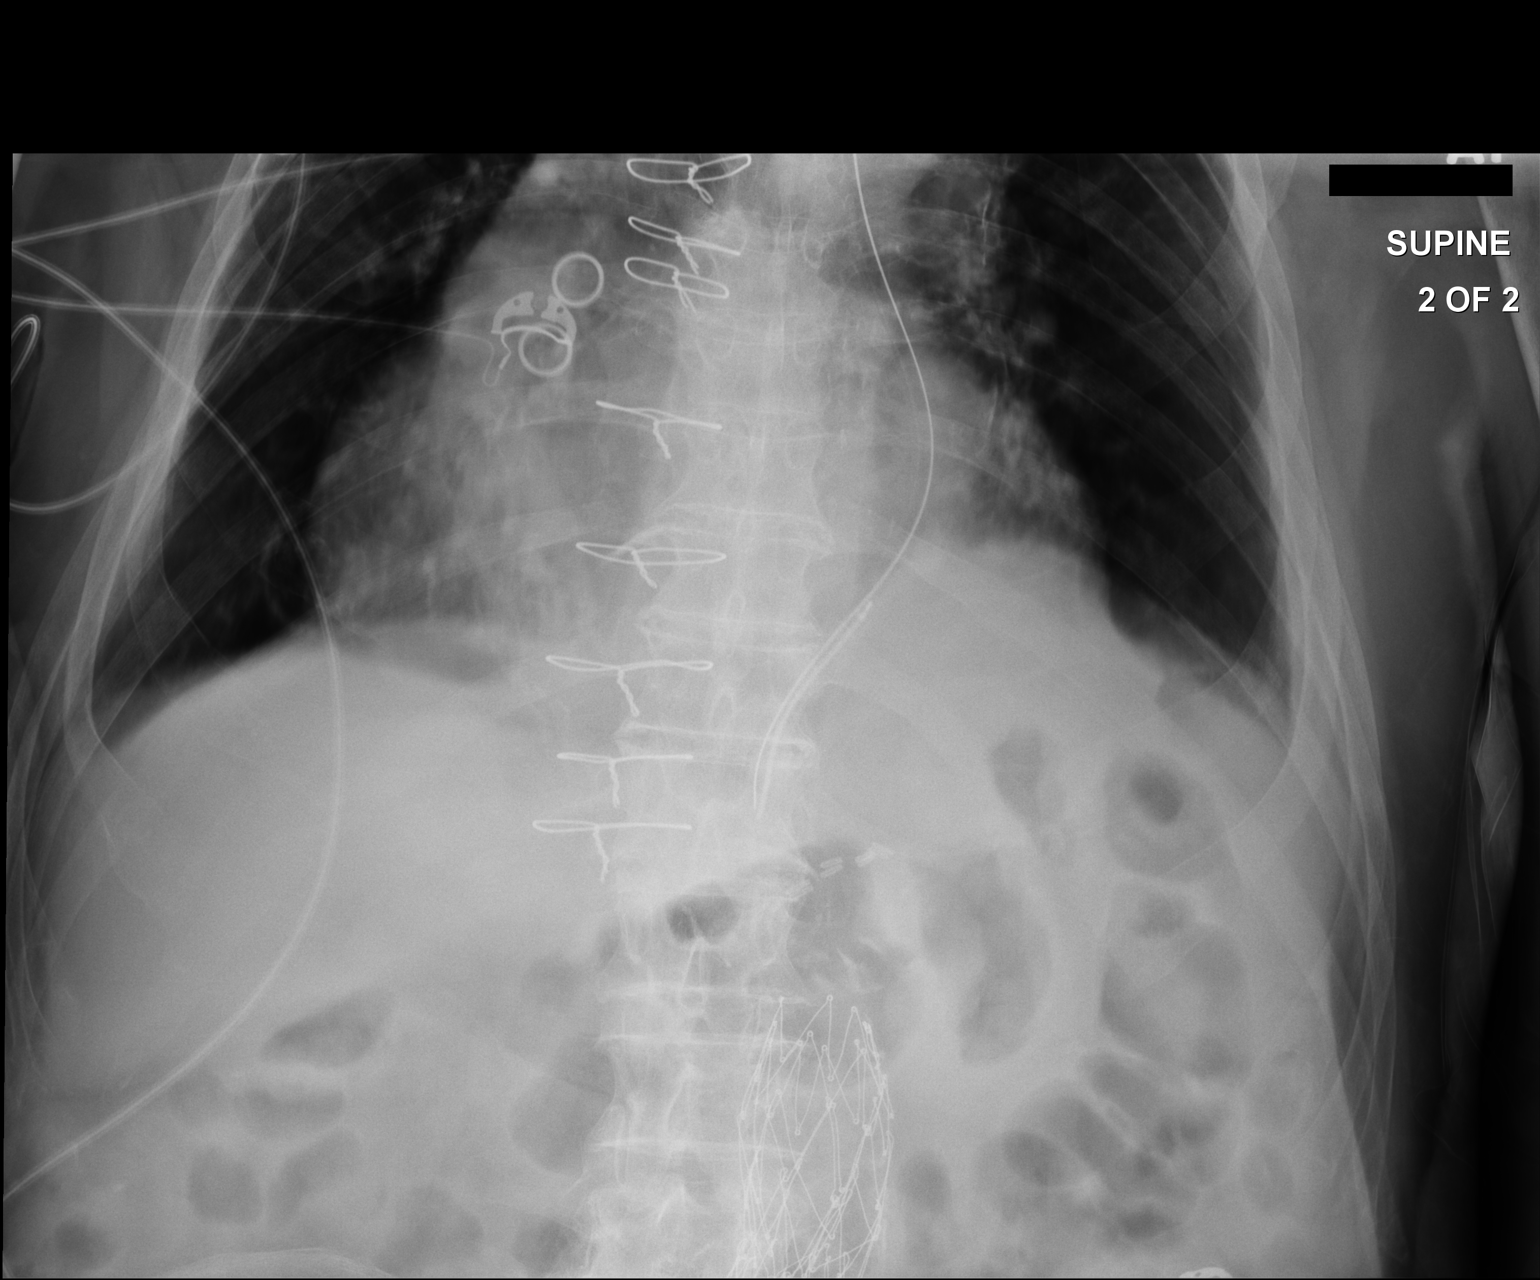

[2 of 2 positions shown; findings below may reference images not displayed]

FINDINGS: 2 supine views.  Aortic Endograft repair.  Nonobstructive
bowel gas pattern.  Probable vascular calcification in the right
hemi pelvis.

Nasogastric tube which terminates at the gastroesophageal junction.
This may be looped on itself.

Patient rotated to the right on the second radiograph.  Small left
pleural effusion.  Cardiomegaly.
IMPRESSION: Nasogastric terminating at the gastroesophageal junction, possibly
looped on self.  Consider advancement

## 2014-05-05 IMAGING — CT CT HEAD W/O CM
2 series · 15 of 30 positions shown, 19 images · non-contrast
Comparison: 12/05/2012.

CLINICAL DATA: Intraventricular hemorrhage.  Increasing lethargy.

CT HEAD WITHOUT CONTRAST
TECHNIQUE: Contiguous axial images were obtained from the base of
the skull through the vertex without contrast.

[Series 2: head w/o · axial · non-contrast · 0.49mm/px · z∈[+76,+226]mm · 13 of 36 slices shown, 17 images]
[im 3/36  brain]
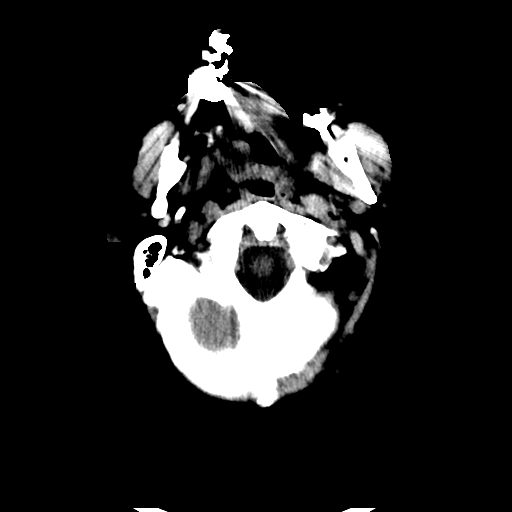
[im 3/36  bone]
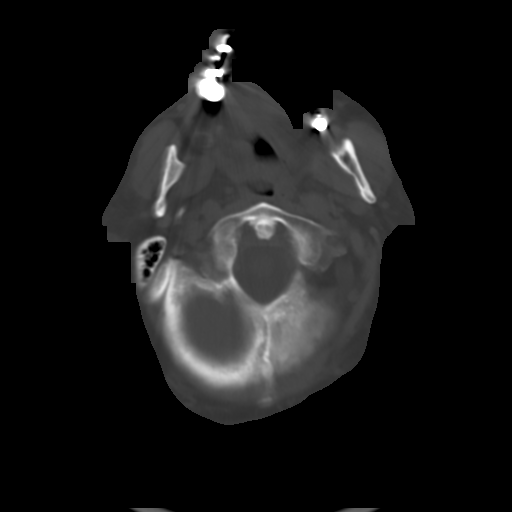
[im 6/36  brain]
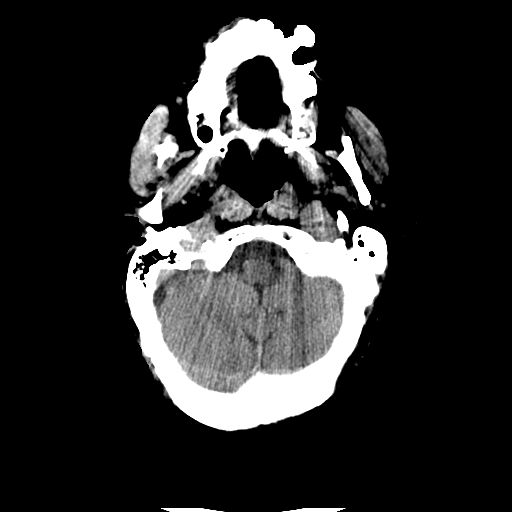
[im 8/36  brain]
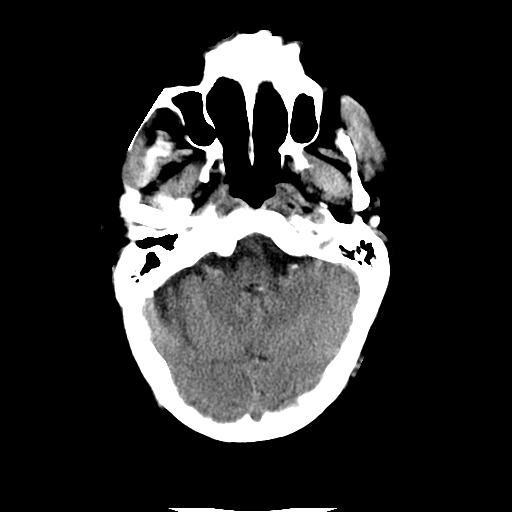
[im 11/36  brain]
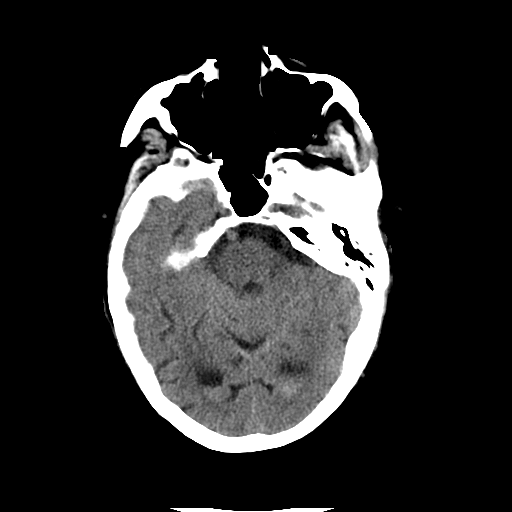
[im 13/36  brain]
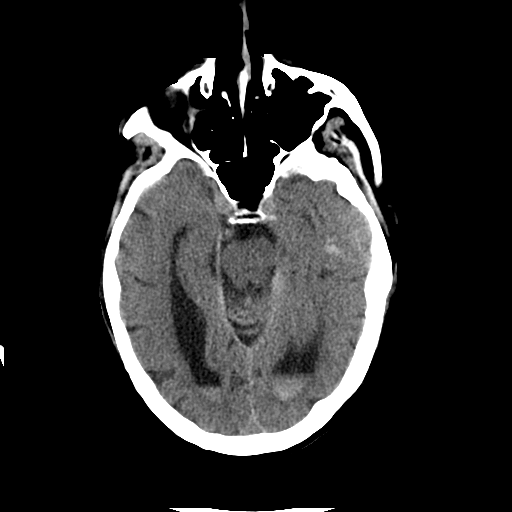
[im 13/36  bone]
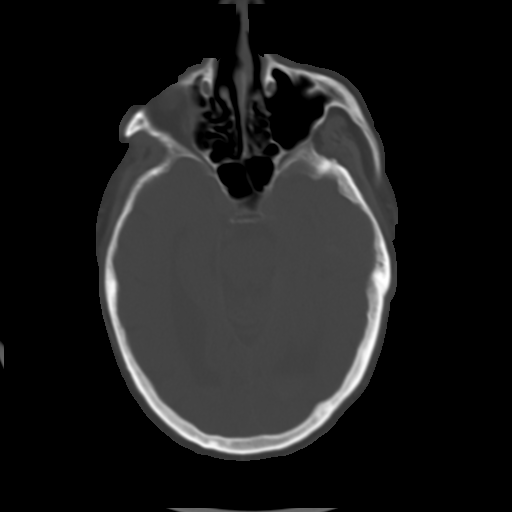
[im 16/36  brain]
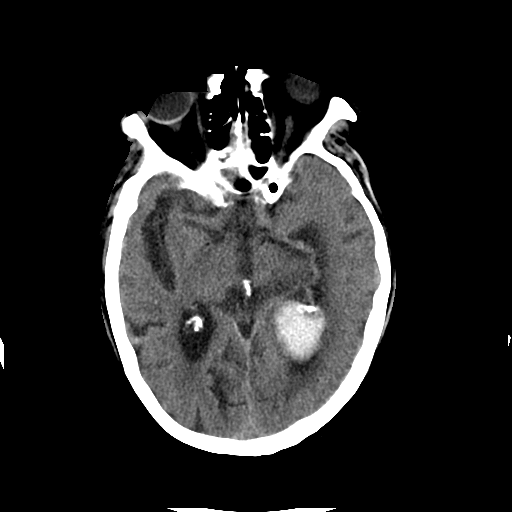
[im 18/36  brain]
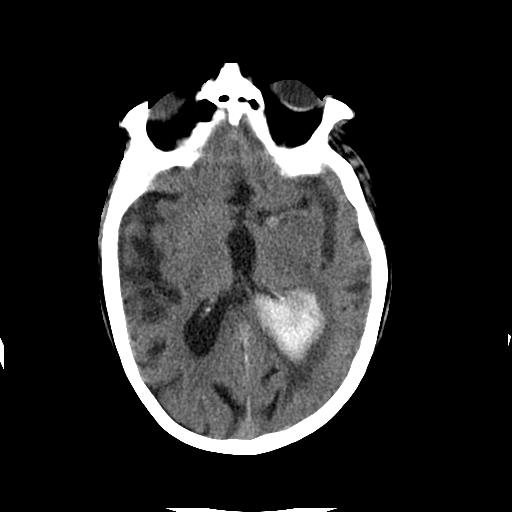
[im 21/36  brain]
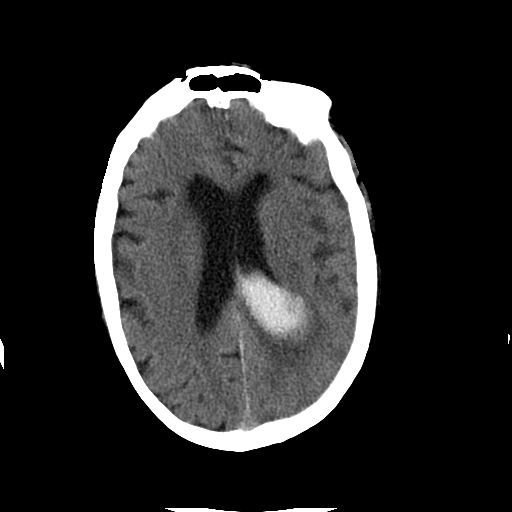
[im 23/36  brain]
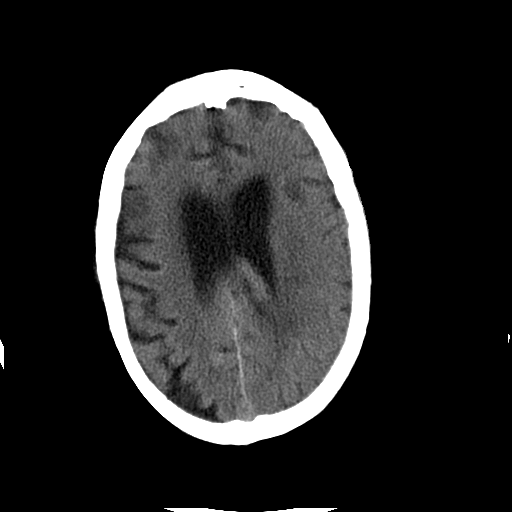
[im 23/36  bone]
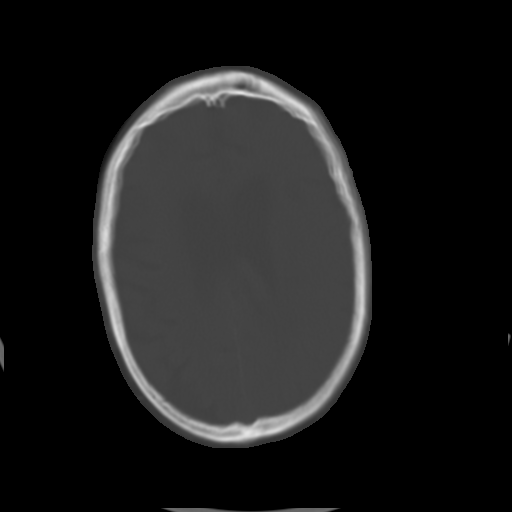
[im 26/36  brain]
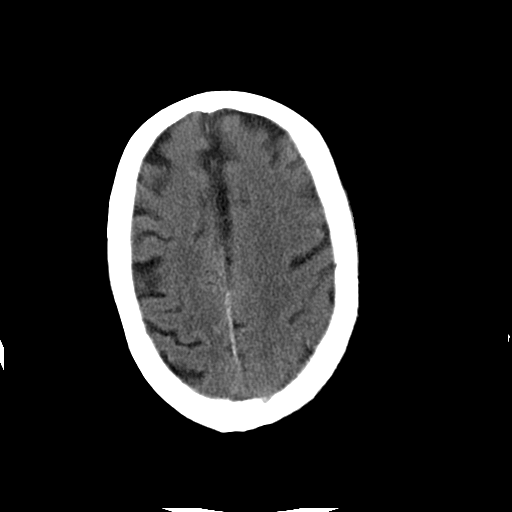
[im 28/36  brain]
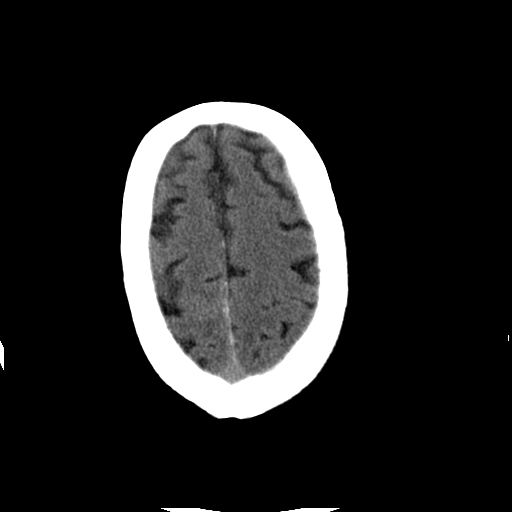
[im 31/36  brain]
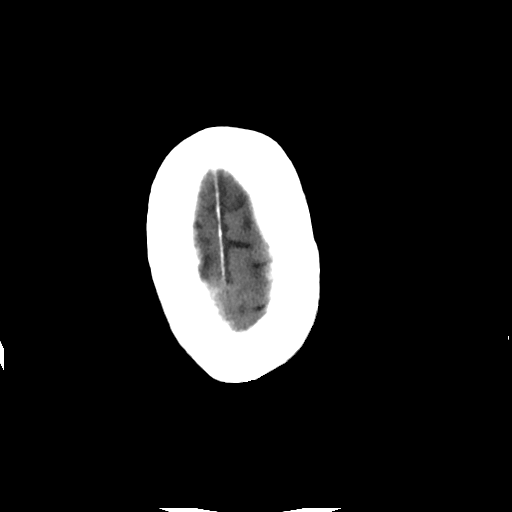
[im 33/36  brain]
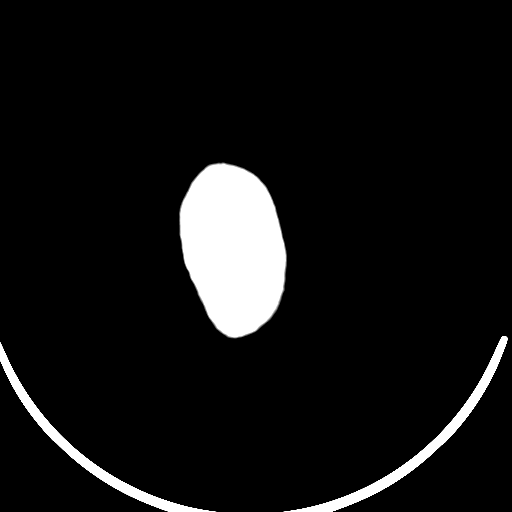
[im 33/36  bone]
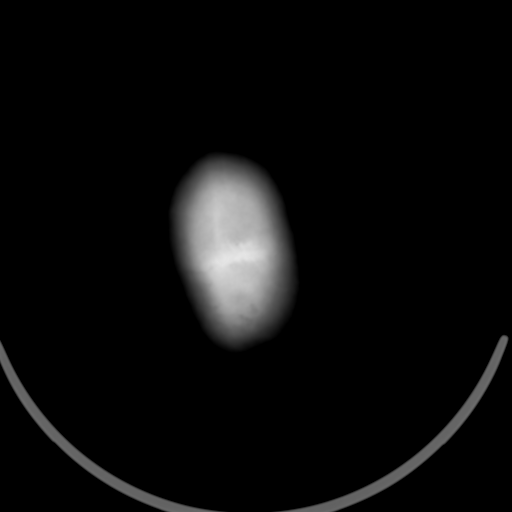

[Series 3: head w/o bone · axial · non-contrast · 0.49mm/px · z∈[+76,+101]mm · 2 of 36 slices shown]
[im 3/36  bone]
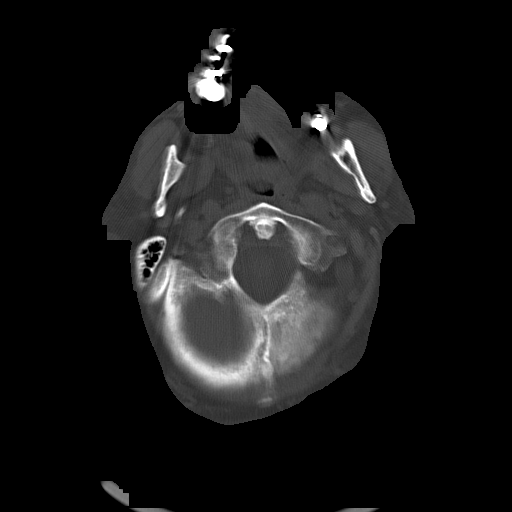
[im 8/36  bone]
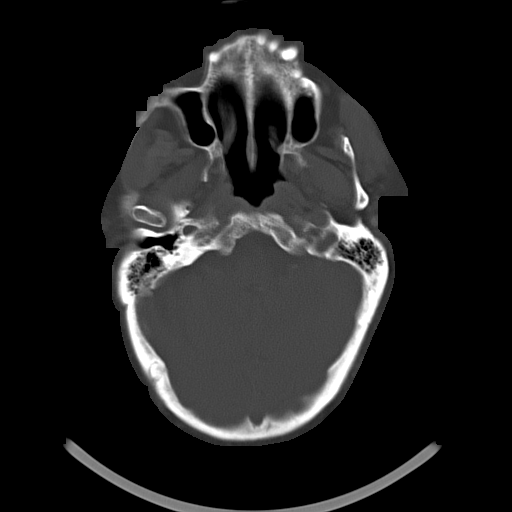

[15 of 30 positions shown; findings below may reference images not displayed]

FINDINGS: Again noted is a large amount of high attenuation
material within the posterior aspect of the left lateral ventricle,
compatible with intraventricular hemorrhage.  This measures up to
approximately 4.7 x 3.1 cm on today's examination and appears
slightly decreased compared to the prior study.  Some dependent
hemorrhage is also noted posteriorly within the lateral ventricles
bilaterally.  No definite hemorrhage within the third ventricle or
fourth ventricle.  No evidence of obstructive hydrocephalous at
this time.  Mild cerebral and cerebellar atrophy is again noted.
Patchy areas of decreased attenuation throughout the deep and
periventricular white matter of the cerebral hemispheres
bilaterally is compatible with mild chronic microvascular ischemic
disease, similar to the prior study.  No acute displaced skull
fractures are identified.  Visualized paranasal sinuses and
mastoids are well pneumatized.
IMPRESSION: 1.  Slight decrease in the volume of intraventricular hemorrhage,
centered at the posterior aspect of the left lateral ventricle.
2.  No evidence of hydrocephalus.
3.  Cerebral and cerebellar atrophy with chronic microvascular
ischemic changes in the cerebral white matter redemonstrated.

## 2014-09-05 NOTE — Op Note (Signed)
PATIENT NAME:  Bryan Lam, Bryan Lam MR#:  [id number] DATE OF BIRTH:  02-Jan-1928  DATE OF PROCEDURE:  05/23/2012  PREOPERATIVE DIAGNOSES:  1. Abdominal aortic aneurysm.  2. Hypertension.  3. Gout.  4. Atrial fibrillation.  5. Coronary disease.  POSTOPERATIVE DIAGNOSES:  1. Abdominal aortic aneurysm.  2. Hypertension.  3. Gout.  4. Atrial fibrillation.  5. Coronary disease.  This is a co-surgeon note with Dr. Gilda Crease dictating his portion of the procedure.   PROCEDURES:  1. Ultrasound guidance for vascular access to bilateral femoral arteries, left by Dr. Gilda Crease, right by Dr. Wyn Quaker.  2. Catheter placement into right femoral artery from left femoral approach by Dr. Gilda Crease.  3. Catheter placement into aorta from right femoral approach by Dr. Wyn Quaker.  4. Placement of an Endologix PowerLink endoprosthesis, 28 mm diameter proximal, 20 mm diameter iliac limbs, co-surgeons.  5. Placement of an aortic extension cuff, 34 mm diameter, with suprarenal fixators, co-surgeons.  SURGEONS: 1. Renford Dills, MD 2. Festus Barren, MD   ANESTHESIA: General.   BLOOD LOSS: 50 mL.  FLUOROSCOPY TIME: Approximately 6 minutes.  CONTRAST USED: 45 mL.   INDICATION FOR PROCEDURE: This is an 81 year old gentleman with a greater than 5 cm abdominal aortic aneurysm. He has acceptable anatomy for endovascular repair.   DESCRIPTION OF THE PROCEDURE: Ultrasound guidance is used to access both femoral arteries. I performed the right and Dr. Gilda Crease performed the left, and permanent images were recorded. An 8-French sheath was placed on the right after the ProGlide Perclose System was placed. Dr. Gilda Crease used two ProGlides on the left and then placed an 8-French sheath. Angiogram was performed to identify the lengths and identify potential problems. The Lunderquist wire was then placed up the left side which would be the ipsilateral side and we upsized to an 18-French sheath on Dr. Marijean Heath side. He then advanced the main  body into the aorta. I placed a snare up into the aorta from right femoral approach and snared his contralateral wire and we advanced that wire and catheter out the right femoral sheath from the left femoral approach. We then parked the main body on the aortic bifurcation and deployed it. This was a 28 mm diameter proximal and 20 mm diameter distal, 30 cm iliac limb device. I then, after deploying the contralateral limb, having advanced a 0.014 wire into the chest, placed the pigtail catheter and evaluated the proximal aorta. An aortic extension cuff was used to gain seal at the base of the right renal artery which was lower. This had a suprarenal fixator. I then regained the true lumen. I ballooned the right iliac limb with a 14 mm diameter noncompliant balloon and the Coda balloon was used to treat the main body and the left iliac limb from the left femoral approach. A completion aortogram was then performed showing good flow through the stent graft with patent renal arteries and the hypogastric arteries. There were no type I or III endoleaks. There may have been a small type II endoleak that occurred late. At this point, we were pleased with our deployment and the result and elected to terminate the procedure. I completed the ProGlide closure of the right femoral artery with excellent hemostatic result. The small skin incision was closed with a single 4-0 Monocryl and Dermabond was placed. Dr. Gilda Crease used a third ProGlide and with the ProGlide system on the left, was able to gain good hemostasis and again a 4-0 Monocryl suture and Dermabond was used to close  the wound. Sterile dressings were placed. The patient tolerated the procedure well and was taken to the recovery room in stable condition.      ____________________________ Annice NeedyJason S. Mandi Mattioli, MD jsd:es D: 05/23/2012 09:42:35 ET T: 05/23/2012 10:25:25 ET JOB#: [id number]  cc: Annice NeedyJason S. Terrin Imparato, MD, <Dictator> Zakir B. Danne HarborWalker III, MD Annice NeedyJASON S Cylis Ayars  MD ELECTRONICALLY SIGNED 05/25/2012 8:32

## 2014-09-05 NOTE — Op Note (Signed)
DATE OF BIRTH:  10/15/1927  DATE OF PROCEDURE:  05/23/2012  PREOPERATIVE DIAGNOSIS:  Abdominal aortic aneurysm.   POSTOPERATIVE DIAGNOSIS:  Abdominal aortic aneurysm.   PROCEDURE PERFORMED: 1.  Repair of abdominal aortic aneurysm using Endologix stent graft.  2.  Bilateral introduction catheter to aorta from femoral approach.  3.  Bilateral closure of arteriotomies percutaneously with the ProGlide device.   SURGEON:  Renford Dills, MD  CO-SURGEON:  Annice Needy, MD  ACCESS:  1.  An 8-French sheath, right common femoral artery.  2.  A 17-French sheath, left common femoral artery.   CONTRAST USED:  Isovue 75 mL.   FLUOROSCOPY TIME:  Approximately 10 minutes.   INDICATIONS:  The patient is an 81 year old gentleman with abdominal aortic aneurysm that now exceeds 5 cm in diameter. He has been evaluated and is found to be a candidate for an Endologix stent graft and is therefore undergoing repair using the endovascular techniques to prevent lethal rupture. The risks and benefits have been reviewed. All questions answered. The patient agrees to proceed.   PROCEDURE:  The patient was taken to the special procedure suite and placed in the supine position. After adequate general anesthesia was induced and appropriate invasive monitors were placed, he was prepped from the nipple line to the knees.   Ultrasound was then placed in a sterile sleeve. Ultrasound was utilized secondary to lack of appropriate landmarks and to avoid vascular injury. Working first on the left side, the common femoral artery was identified. It was scanned down to the bifurcation and then back to the mid portion of the common femoral in an area that was free of plaque. Image was recorded for the permanent record. Artery was echolucent and pulsatile indicating patency, and under direct real-time visualization, a micropuncture needle was used to access the common femoral, microwire followed by microsheath, J-wire followed  by a 6-French sheath.   Working in a similar fashion, Dr. Wyn Quaker on the right side used the ultrasound. Again, femoral artery was identified. It was pulsatile and echolucent indicating patency. Image was recorded, and a Seldinger needle was used for direct puncture. J-wire was advanced, then a 6-French sheath. ProGlide was then utilized on the right in a preclose fashion. Two ProGlide devices were utilized on the left in a preclose fashion.   Catheter was then advanced up the right side after the sheath was upsized to an 8-French, and AP projection of the aorta was obtained. Once the measurements had been made with the pigtail catheter, a Lunderquist wire was advanced through the pigtail catheter. The pigtail catheter was advanced into the descending aorta under fluoroscopic guidance to negotiate the tortuosity with the Lunderquist. J-wire was advanced up the right side. A 17-French AFX sheath was then advanced in exchange for the left 8-French sheath, and under fluoroscopy, the sheath was positioned with its tip in the aorta. It was loaded with a 28 x 90 x 20 x 30 AFX bifurcated device. The SurePass wire was then advanced through the 17-French sheath and snared from the right side by Dr. Wyn Quaker. The device was then delivered into the aorta with retraction on the SurePass wire. Prior to deploying the device, wire wrap was ensured not to have occurred, and then the device was seated onto the bifurcation. The SurePass wire was then transected, and a 0.014 [place] wire was advanced through the contralateral limb. The main body of the graft was then deployed by pulling on the control handle. The AFX sheath was  then deployed on the ipsilateral limb by retracting the SurePass wire and deploying the contralateral limb. A 30 x 100 suprarenal aortic cuff stent was then advanced up the 17-French AFX sheath. Pigtail catheter was advanced up the right side, and a magnified oblique view of the aorta was obtained. Localizing the  right renal artery, which was the lower renal artery, the stent was then deployed above the renal and then positioned in place. Coda balloon was then advanced up the right side and used to secure the main body, as well as the overlap between the bifurcated portion and the suprarenal stent portion and the left iliac portion. A 14 x 60 balloon was inserted up the right side and used to seal the right iliac portion. Pigtail catheter was then reintroduced, and delayed images were obtained. Perhaps, a small type 2 endoleak was noted, although this did not appear to be certain given the very weak contrast. There was no evidence of a type 1 or type 3 endoleak.   First, the 8-French sheath was removed and secured with the ProGlide with hemostasis. Then, the 17-French sheath was removed. Both ProGlides were secured down. There still seemed to be a fair bit of oozing, and the ProGlide was advanced over the wire and deployed in the AP projection. This significantly reduced the bleeding, and then the wire was removed, and all 3 knots were secured. Both groin puncture sites were closed with a subcuticular 4-0 Monocryl and then a Dermabond.   The patient tolerated the procedure well. There were no immediate complications. Sponge and needle counts were correct, and he was taken to the recovery area in stable condition.   INTERPRETATION:  Initial views demonstrate a large aneurysm. Following the deployment of the grafts and post dilatation, there are no significant endoleaks noted.     ____________________________ Renford DillsGregory G. Kenya Kook, MD ggs:ms D: 05/23/2012 17:45:47 ET T: 05/23/2012 19:51:55 ET JOB#: [id number]  cc: Renford DillsGregory G. Keira Bohlin, MD, <Dictator> Renford DillsGREGORY G Jenesa Foresta MD ELECTRONICALLY SIGNED 05/25/2012 7:52
